# Patient Record
Sex: Female | Born: 1990 | Race: Black or African American | Hispanic: No | Marital: Single | Smoking: Never smoker
Health system: Southern US, Community
[De-identification: ages and names within clinical notes are randomized; demographics above are authoritative.]

## PROBLEM LIST (undated history)

## (undated) DIAGNOSIS — D571 Sickle-cell disease without crisis: Secondary | ICD-10-CM

## (undated) DIAGNOSIS — A419 Sepsis, unspecified organism: Secondary | ICD-10-CM

## (undated) DIAGNOSIS — I82409 Acute embolism and thrombosis of unspecified deep veins of unspecified lower extremity: Secondary | ICD-10-CM

## (undated) DIAGNOSIS — O139 Gestational [pregnancy-induced] hypertension without significant proteinuria, unspecified trimester: Secondary | ICD-10-CM

## (undated) HISTORY — DX: Sepsis, unspecified organism: A41.9

## (undated) HISTORY — DX: Acute embolism and thrombosis of unspecified deep veins of unspecified lower extremity: I82.409

---

## 2021-03-08 ENCOUNTER — Encounter (HOSPITAL_COMMUNITY): Payer: Self-pay | Admitting: Emergency Medicine

## 2021-03-08 ENCOUNTER — Other Ambulatory Visit: Payer: Self-pay

## 2021-03-08 ENCOUNTER — Ambulatory Visit (HOSPITAL_COMMUNITY)
Admission: EM | Admit: 2021-03-08 | Discharge: 2021-03-08 | Disposition: A | Discharge status: Home / Self Care | Payer: Medicaid Other

## 2021-03-08 DIAGNOSIS — R21 Rash and other nonspecific skin eruption: Secondary | ICD-10-CM

## 2021-03-08 HISTORY — DX: Sickle-cell disease without crisis: D57.1

## 2021-03-08 MED ORDER — METRONIDAZOLE 1 % EX GEL: Freq: Every day | CUTANEOUS | 0 refills | Status: DC

## 2021-03-08 NOTE — Discharge Instructions (Addendum)
Switch from mint or cinnamon flavored toothpaste to fruit flavors x1 months to see if this helps with symptoms

## 2021-03-08 NOTE — ED Provider Notes (Signed)
[facility]    CSN: [id number] Arrival date & time: 03/08/21  8144      History   Chief Complaint Chief Complaint  Patient presents with  . Rash    HPI Sonya Tapia is a 30 y.o. female.   HPI   Rash: Patient states that she has had a facial rash that started around December.  The rash has improved but has not fully resolved.  She states that the rash has improved with reducing any products to the face.  She believes this is perioral dermatitis as she has watched many videos on TikTok that describes similar symptoms of pustules and bumps around the nose and mouth area.  The only products she is using are a gentle facial wash Cetaphil and a gentle facial moisturizer for sensitive skin. No trouble breathing or swallowing and no swelling of the face. No new medications. No oral, nasal or topical steroid use.   Past Medical History:  Diagnosis Date  . Sickle cell anemia (HCC)     There are no problems to display for this patient.   Past Surgical History:  Procedure Laterality Date  . CESAREAN SECTION      OB History   No obstetric history on file.      Home Medications    Prior to Admission medications   Medication Sig Start Date End Date Taking? Authorizing Provider  norethindrone (MICRONOR) 0.35 MG tablet Take 1 tablet by mouth daily. 02/19/21  Yes [provider]    Family History Family History  Problem Relation Age of Onset  . Healthy Mother   . Healthy Father     Social History Social History   Tobacco Use  . Smoking status: Never Smoker  . Smokeless tobacco: Never Used  Vaping Use  . Vaping Use: Former  Substance Use Topics  . Alcohol use: Not Currently    Comment: occ  . Drug use: Never     Allergies   Patient has no known allergies.   Review of Systems Review of Systems  As stated above in HPI Physical Exam Triage Vital Signs ED Triage Vitals  Enc Vitals Group     BP 03/08/21 0832 (!) 127/95     Pulse Rate  03/08/21 0834 64     Resp 03/08/21 0834 16     Temp 03/08/21 0832 98.3 F (36.8 C)     Temp src --      SpO2 03/08/21 0834 100 %     Weight 03/08/21 0834 180 lb (81.6 kg)     Height 03/08/21 0834 5\' 4"  (1.626 m)     Head Circumference --      Peak Flow --      Pain Score 03/08/21 0827 0     Pain Loc --      Pain Edu? --      Excl. in GC? --    No data found.  Updated Vital Signs BP (!) 127/95 (BP Location: Right Arm)   Pulse 64   Temp 98.3 F (36.8 C)   Resp 16   Ht 5\' 4"  (1.626 m)   Wt 180 lb (81.6 kg)   LMP 03/01/2021 (Exact Date)   SpO2 100%   BMI 30.90 kg/m   Physical Exam Vitals and nursing note reviewed.  Constitutional:      General: She is not in acute distress.    Appearance: Normal appearance. She is not ill-appearing, toxic-appearing or diaphoretic.  HENT:     Mouth/Throat:  Comments: No swelling of the lips or tongue Skin:    Comments: Rash is difficult to visualize. Scattered small papules of mouth and nose with scant erythema.   Neurological:     Mental Status: She is alert.      UC Treatments / Results  Labs (all labs ordered are listed, but only abnormal results are displayed) Labs Reviewed - No data to display  EKG   Radiology No results found.  Procedures Procedures (including critical care time)  Medications Ordered in UC Medications - No data to display  Initial Impression / Assessment and Plan / UC Course  I have reviewed the triage vital signs and the nursing notes.  Pertinent labs & imaging results that were available during my care of the patient were reviewed by me and considered in my medical decision making (see chart for details).     New.  Discussed perioral dermatitis with patient.  I have recommended that she switch from meat-based toothpaste to fruit-based toothpaste x1 month to see if this helps with symptoms.  She can continue her products as these should not cause irritation and she has not noticed a  correlation.  If in 1 month symptoms are not improved or should symptoms worsen she will start the MetroGel to treat for any potential rosacea.  Final Clinical Impressions(s) / UC Diagnoses   Final diagnoses:  None   Discharge Instructions   None    ED Prescriptions    None     PDMP not reviewed this encounter.   Rushie Chestnut, [place] 03/08/21 [phone]

## 2021-03-08 NOTE — ED Triage Notes (Signed)
Pt presents today with intermittent facial rash that began in December. No swelling or SOB.

## 2021-07-16 DIAGNOSIS — E559 Vitamin D deficiency, unspecified: Secondary | ICD-10-CM

## 2021-07-16 HISTORY — DX: Vitamin D deficiency, unspecified: E55.9

## 2022-01-23 ENCOUNTER — Telehealth (INDEPENDENT_AMBULATORY_CARE_PROVIDER_SITE_OTHER): Payer: Medicaid Other

## 2022-01-23 DIAGNOSIS — O0990 Supervision of high risk pregnancy, unspecified, unspecified trimester: Secondary | ICD-10-CM | POA: Insufficient documentation

## 2022-01-23 DIAGNOSIS — D571 Sickle-cell disease without crisis: Secondary | ICD-10-CM | POA: Insufficient documentation

## 2022-01-23 DIAGNOSIS — Z3A00 Weeks of gestation of pregnancy not specified: Secondary | ICD-10-CM

## 2022-01-23 DIAGNOSIS — O99019 Anemia complicating pregnancy, unspecified trimester: Secondary | ICD-10-CM

## 2022-01-23 MED ORDER — BLOOD PRESSURE MONITORING DEVI: 1.0000 | 0 refills | Status: DC

## 2022-01-23 NOTE — Patient Instructions (Signed)

## 2022-01-23 NOTE — Progress Notes (Signed)
New OB Intake ? ?I connected with  Sonya Tapia on 01/23/22 at  1:15 PM EST by MyChart Video Visit and verified that I am speaking with the correct person using two identifiers. Nurse is located at [facility] and pt is located at [employer]. ? ?I discussed the limitations, risks, security and privacy concerns of performing an evaluation and management service by telephone and the availability of in person appointments. I also discussed with the patient that there may be a patient responsible charge related to this service. The patient expressed understanding and agreed to proceed. ? ?I explained I am completing New OB Intake today. We discussed her EDD of 06/24/22 that is based on LMP of 09/17/21. Pt is G2/P1. I reviewed her allergies, medications, Medical/Surgical/OB history, and appropriate screenings. I informed her of [facility] services. Based on history, this is a/an  pregnancy complicated by Sickle Cell  .  ? ?Patient Active Problem List  ? Diagnosis Date Noted  ? Supervision of high risk pregnancy, antepartum 01/23/2022  ? Sickle cell anemia of mother during pregnancy [facility]) 01/23/2022  ? ? ?Concerns addressed today ? ?Delivery Plans:  ?Plans to deliver at [facility].  ? ?Waterbirth candidate?  ? ?MyChart/Babyscripts ?MyChart access verified. I explained pt will have some visits in office and some virtually. Babyscripts instructions given and order placed. Patient verifies receipt of registration text/e-mail. Account successfully created and app downloaded. ? ?Blood Pressure Cuff  ?Blood pressure cuff ordered for patient to pick-up from [employer]. Explained after first prenatal appt pt will check weekly and document in 30. ? ?Weight scale: Patient does / does not  have weight scale. Weight scale ordered for patient to pick up from [employer].  ? ?Anatomy US ?Explained first scheduled [place] will be around 19 weeks. Anatomy US scheduled for 02/06/22 at 01:30. Pt notified to arrive at 01:15. ?Scheduled AFP  lab only appointment if CenteringPregnancy pt for same day as anatomy US.  ? ?Labs ?Discussed [name] genetic screening with patient. Would like both Panorama and Horizon drawn at new OB visit.Also if interested in genetic testing, tell patient she will need AFP 15-21 weeks to complete genetic testing .Routine prenatal labs needed. ? ?Covid Vaccine ?Patient has covid vaccine.  ? ?Is patient a CenteringPregnancy candidate? Not a candidate  ? ?Is patient a Mom+Baby Combined Care candidate? Not a candidate    ? ?Informed patient of [facility] website  and placed link in her AVS.  ? ?Social Determinants of Health ?Food Insecurity: Patient denies food insecurity. ?WIC Referral: Patient is interested in referral to [facility].  ?Transportation: Patient denies transportation needs. ?Childcare: Discussed no children allowed at ultrasound appointments. Offered childcare services; patient declines childcare services at this time. ? ?Send link to Pregnancy Navigators ? ? ?Placed OB Box on problem list and updated ? ?First visit review ?I reviewed new OB appt with pt. I explained she will have a pelvic exam, ob bloodwork with genetic screening, and PAP smear. Explained pt will be seen by Dr.Constant at first visit; encounter routed to appropriate provider. Explained that patient will be seen by pregnancy navigator following visit with provider. [facility] information placed in AVS.  ? ?Bethanne Ginger, CMA ?01/23/2022  1:44 PM  ?

## 2022-01-25 NOTE — Progress Notes (Signed)
Patient was assessed and managed by nursing staff during this encounter. I have reviewed the chart and agree with the documentation and plan. I have also made any necessary editorial changes. ? ?Mora Bellman, MD ?01/25/2022 1:35 PM  ? ?

## 2022-01-30 ENCOUNTER — Telehealth: Payer: Self-pay | Admitting: *Deleted

## 2022-01-30 NOTE — Telephone Encounter (Signed)
Called patient to offer CenteringPregnancy prenatal care. Explained what this means. Patient declines. ?Sonya Tapia ?

## 2022-02-06 ENCOUNTER — Ambulatory Visit: Payer: Medicaid Other | Admitting: *Deleted

## 2022-02-06 ENCOUNTER — Ambulatory Visit (HOSPITAL_BASED_OUTPATIENT_CLINIC_OR_DEPARTMENT_OTHER): Payer: Medicaid Other | Admitting: Obstetrics and Gynecology

## 2022-02-06 ENCOUNTER — Ambulatory Visit: Payer: Medicaid Other | Attending: Obstetrics and Gynecology

## 2022-02-06 ENCOUNTER — Other Ambulatory Visit: Payer: Self-pay | Admitting: *Deleted

## 2022-02-06 ENCOUNTER — Other Ambulatory Visit (HOSPITAL_COMMUNITY)
Admission: RE | Admit: 2022-02-06 | Discharge: 2022-02-06 | Disposition: A | Discharge status: Home / Self Care | Payer: Medicaid Other | Source: Ambulatory Visit | Attending: Obstetrics and Gynecology | Admitting: Obstetrics and Gynecology

## 2022-02-06 ENCOUNTER — Other Ambulatory Visit: Payer: Self-pay

## 2022-02-06 ENCOUNTER — Ambulatory Visit (INDEPENDENT_AMBULATORY_CARE_PROVIDER_SITE_OTHER): Payer: Medicaid Other | Admitting: Obstetrics and Gynecology

## 2022-02-06 ENCOUNTER — Encounter: Payer: Self-pay | Admitting: Obstetrics and Gynecology

## 2022-02-06 VITALS — BP 119/76 | HR 87

## 2022-02-06 VITALS — BP 116/77 | HR 85 | Wt 177.3 lb

## 2022-02-06 DIAGNOSIS — O0990 Supervision of high risk pregnancy, unspecified, unspecified trimester: Secondary | ICD-10-CM

## 2022-02-06 DIAGNOSIS — O34219 Maternal care for unspecified type scar from previous cesarean delivery: Secondary | ICD-10-CM

## 2022-02-06 DIAGNOSIS — Z363 Encounter for antenatal screening for malformations: Secondary | ICD-10-CM | POA: Insufficient documentation

## 2022-02-06 DIAGNOSIS — D571 Sickle-cell disease without crisis: Secondary | ICD-10-CM

## 2022-02-06 DIAGNOSIS — Z3A19 19 weeks gestation of pregnancy: Secondary | ICD-10-CM | POA: Diagnosis not present

## 2022-02-06 DIAGNOSIS — D563 Thalassemia minor: Secondary | ICD-10-CM

## 2022-02-06 DIAGNOSIS — Z8616 Personal history of COVID-19: Secondary | ICD-10-CM | POA: Insufficient documentation

## 2022-02-06 DIAGNOSIS — O99019 Anemia complicating pregnancy, unspecified trimester: Secondary | ICD-10-CM | POA: Insufficient documentation

## 2022-02-06 DIAGNOSIS — O99012 Anemia complicating pregnancy, second trimester: Secondary | ICD-10-CM | POA: Diagnosis not present

## 2022-02-06 DIAGNOSIS — O0992 Supervision of high risk pregnancy, unspecified, second trimester: Secondary | ICD-10-CM | POA: Insufficient documentation

## 2022-02-06 DIAGNOSIS — Z98891 History of uterine scar from previous surgery: Secondary | ICD-10-CM | POA: Insufficient documentation

## 2022-02-06 LAB — POCT URINALYSIS DIP (DEVICE)
Glucose, UA: NEGATIVE mg/dL
Hgb urine dipstick: NEGATIVE
Ketones, ur: 40 mg/dL — AB
Leukocytes,Ua: NEGATIVE
Nitrite: NEGATIVE
Protein, ur: NEGATIVE mg/dL
Specific Gravity, Urine: 1.02 (ref 1.005–1.030)
Urobilinogen, UA: 0.2 mg/dL (ref 0.0–1.0)
pH: 5.5 (ref 5.0–8.0)

## 2022-02-06 MED ORDER — ASPIRIN EC 81 MG PO TBEC: 81.0000 mg | DELAYED_RELEASE_TABLET | Freq: Every day | ORAL | 2 refills | Status: DC

## 2022-02-06 NOTE — Progress Notes (Signed)
Maternal-Fetal Medicine  ? ?Name: Sonya Tapia ?DOB: [birth year] ?MRN: [id number] ?Referring Provider: Catalina Antigua, MD ? ?I had the pleasure of seeing Ms. Cahn [facility]. She is G2 P1001 at 20w+ gestation and is here for fetal anatomy scan. She is unsure of her LMP date and this is here first ultrasound. ?Patient has hemoglobin [place] disease.  She reports she has not had acute pain crisis since age 31 when she was admitted to the hospital.  No history of acute chest syndrome or pneumonia.  No history of recurrent urinary tract infections.  No history of blood transfusions.  No history of stroke or any neurological complications.  No history of deep vein thrombosis. ?Patient had COVID infection in 2020 during her pregnancy and had an uneventful recovery. ?No history of hypertension or diabetes or any other chronic medical conditions. ? ?Past surgical history: Nil of note. ?Medications: Prenatal vitamins, vitamin D supplements. ?Allergies: No known drug allergies. ?Social history: Denies tobacco or drug or alcohol use.  Her partner is [place] American he is a father of her first child.  His sickle cell carrier status is not known. ?Family history: No history of sickle cell anemia in the family.  Siblings have sickle cell trait. ? ?Obstetric history significant for a term cesarean delivery (failure to progress in labor) November 2021 of a female infant weighing 8 pounds and 1 ounce at birth.  Her pregnancy was uneventful. ?GYN history: No history of abnormal Pap smears or cervical surgeries.  No history of breast disease. ? ?Ultrasound ?We performed a fetal anatomical survey.  Amniotic fluid is normal and good fetal activity seen.  No markers of aneuploidies or fetal structural defects are seen.  Fetal biometry is consistent with 19 weeks and 6 days gestation. ?Placenta is anterior and there is no evidence of previa or placenta accreta spectrum ?We have assigned her EDD at 06/27/2022  based on today's ultrasound measurements. ? ?Labs: Patient had prenatal labs drawn today.  Her previous hemoglobin (November 2022) was 11 g/dL. ? ?Our concerns include ?Sickle cell anemia ?-Pregnancy in women with sickle cell anemia is associated with high risk morbidity and mortality.  Pregnant women are at an increased risk of sickle-related complications including acute pain crisis and acute chest syndrome (occurs in 10% of patients). ? ?-Management of acute pain crisis involves intravenous fluids, morphine and blood transfusion as needed. ? ?-Hypertension, venous thromboembolism and urinary tract infections are increased.  Incidence of deep vein thrombosis is higher in pregnant women. ? ?-Incidence of hypertension/preeclampsia is increased.  I discussed the benefit of low-dose aspirin prophylaxis in this pregnancy that delays or prevents preeclampsia.  Preterm delivery is more common in pregnancies complicated with sickle cell anemia. ? ?-Overall, the risk of complications is lower with hemoglobin [place] disease. ? ?-In the absence of hospitalization and prolonged immobilization, thromboprophylaxis is not recommended. ? ?-Discussed the importance of vaccination including pneumococcal and influenza vaccine.  Both may be given in pregnancy. ? ?I discussed ultrasound protocol of monitoring fetal growth assessments every 4 weeks and weekly BPP from [redacted] weeks gestation till delivery. ? ?Because of increased risk of placental insufficiency and preeclampsia, delivery between 38 and [redacted] weeks gestation is reasonable. ? ?Prenatal diagnosis ?I emphasized the importance of partner screening.  If her partner is a carrier, there is a 50% chance of the fetus being affected with sickle cell disease.  I recommended genetic counseling.  Patient would like to discuss with her partner and  decide. ? ?Previous cesarean delivery ?I discussed VBAC and its benefits and risks.  The risk of uterine scar rupture is about 1%.  Repeat cesarean  deliveries increase the risk of placenta previa and/or placenta accreta spectrum. ? ?Recommendations ?-Low-dose aspirin prophylaxis. ?-Vaccinations to be addressed at prenatal visit. ?-Genetic counseling of the patient is willing. ?-Screen for urinary tract infections every trimester. ?-Baseline comprehensive metabolic panel ?-Maternal echocardiography may be considered if patient has symptoms of shortness of breath or chest pain. ?-Vitamin D supplements to continue ?-Fetal growth assessments every 4 weeks. ?-Weekly BPP from [redacted] weeks gestation till delivery. ?-Delivery at 71- or 39-weeks' gestation. ? ?Thank you for consultation.  If you have any questions or concerns, please contact me the [facility].  Consultation including face-to-face (more than 50%) counseling 45 minutes. ? ?

## 2022-02-06 NOTE — Progress Notes (Signed)
?  Subjective:  ? ? Sonya Tapia is a G2P1001 [redacted]w[redacted]d being seen today for her first obstetrical visit.  Her obstetrical history is significant for obesity and previous cesarean section due to failure to progress at 4 cm . Patient reports history of sickle cell disease with last crisis at the age of 19. Patient does intend to breast feed. Pregnancy history fully reviewed. ? ?Patient reports no complaints. ? ?Vitals:  ? 02/06/22 1555  ?BP: 116/77  ?Pulse: 85  ?Weight: 177 lb 4.8 oz (80.4 kg)  ? ? ?HISTORY: ?OB History  ?Gravida Para Term Preterm AB Living  ?2 1 1     1   ?SAB IAB Ectopic Multiple Live Births  ?        1  ?  ?# Outcome Date GA Lbr Len/2nd Weight Sex Delivery Anes PTL Lv  ?2 Current           ?1 Term 03/26/20   8 lb (3.629 kg) M CS-Unspec  N LIV  ? ?Past Medical History:  ?Diagnosis Date  ?? DVT (deep venous thrombosis) ([place])   ?? Sickle cell anemia (HCC)   ? ?Past Surgical History:  ?Procedure Laterality Date  ?? CESAREAN SECTION    ? ?Family History  ?Problem Relation Age of Onset  ?? Hypertension Mother   ?? Healthy Father   ? ? ? ?Exam  ? ? ?Uterus:  Fundal Height: 20 cm  ?Pelvic Exam:   ? Perineum: No Hemorrhoids, Normal Perineum  ? Vulva: normal  ? Vagina:  normal mucosa, normal discharge  ? pH:   ? Cervix: multiparous appearance and cervix is closed and long  ? Adnexa: not evaluated  ? Bony Pelvis: gynecoid  ?System: Breast:  normal appearance, no masses or tenderness  ? Skin: normal coloration and turgor, no rashes ?  ? Neurologic: oriented, no focal deficits  ? Extremities: normal strength, tone, and muscle mass  ? HEENT extra ocular movement intact  ? Mouth/Teeth mucous membranes moist, pharynx normal without lesions and dental hygiene good  ? Neck supple and no masses  ? Cardiovascular: regular rate and rhythm  ? Respiratory:  appears well, vitals normal, no respiratory distress, acyanotic, normal RR, chest clear, no wheezing, crepitations, rhonchi, normal symmetric air entry  ? Abdomen: soft,  non-tender; bowel sounds normal; no masses,  no organomegaly and gravid  ? Urinary:   ? ? ?  ?Assessment:  ? ? Pregnancy: G2P1001 ?Patient Active Problem List  ? Diagnosis Date Noted  ?? Previous cesarean section 02/06/2022  ?? Supervision of high risk pregnancy, antepartum 01/23/2022  ?? Sickle cell anemia of mother during pregnancy [facility]) 01/23/2022  ? ?  ? ?  ?Plan:  ? ?  ?Initial labs drawn. ?Prenatal vitamins. ?Problem list reviewed and updated. ?Genetic Screening discussed : ordered. ? Ultrasound discussed; fetal survey: done today. ?Information on TOLAC vs RCS provided ? Follow up in 4 weeks. ?50% of 30 min visit spent on counseling and coordination of care.  ?  ? ?Jaislyn Blinn ?02/06/2022 ? ? ?

## 2022-02-06 NOTE — Patient Instructions (Signed)

## 2022-02-07 LAB — CBC/D/PLT+RPR+RH+ABO+RUBIGG...
Antibody Screen: NEGATIVE
Basophils Absolute: 0 10*3/uL (ref 0.0–0.2)
Basos: 0 %
EOS (ABSOLUTE): 0 10*3/uL (ref 0.0–0.4)
Eos: 0 %
HCV Ab: NONREACTIVE
HIV Screen 4th Generation wRfx: NONREACTIVE
Hematocrit: 29.4 % — ABNORMAL LOW (ref 34.0–46.6)
Hemoglobin: 9.8 g/dL — ABNORMAL LOW (ref 11.1–15.9)
Hepatitis B Surface Ag: NEGATIVE
Immature Grans (Abs): 0 10*3/uL (ref 0.0–0.1)
Immature Granulocytes: 0 %
Lymphocytes Absolute: 1.1 10*3/uL (ref 0.7–3.1)
Lymphs: 16 %
MCH: 26.2 pg — ABNORMAL LOW (ref 26.6–33.0)
MCHC: 33.3 g/dL (ref 31.5–35.7)
MCV: 79 fL (ref 79–97)
Monocytes Absolute: 0.3 10*3/uL (ref 0.1–0.9)
Monocytes: 4 %
Neutrophils Absolute: 5.4 10*3/uL (ref 1.4–7.0)
Neutrophils: 80 %
Platelets: 135 10*3/uL — ABNORMAL LOW (ref 150–450)
RBC: 3.74 x10E6/uL — ABNORMAL LOW (ref 3.77–5.28)
RDW: 16.1 % — ABNORMAL HIGH (ref 11.7–15.4)
RPR Ser Ql: NONREACTIVE
Rh Factor: POSITIVE
Rubella Antibodies, IGG: 5.71 index (ref >0.99)
WBC: 6.8 10*3/uL (ref 3.4–10.8)

## 2022-02-07 LAB — HCV INTERPRETATION

## 2022-02-07 LAB — GC/CHLAMYDIA PROBE AMP (~~LOC~~) NOT AT ARMC
Chlamydia: NEGATIVE
Comment: NEGATIVE
Comment: NORMAL
Neisseria Gonorrhea: NEGATIVE

## 2022-02-07 LAB — HEMOGLOBIN A1C
Est. average glucose Bld gHb Est-mCnc: <74 mg/dL
Hgb A1c MFr Bld: <4.2 % — ABNORMAL LOW (ref 4.8–5.6)

## 2022-02-07 MED ORDER — FERROUS SULFATE 325 (65 FE) MG PO TABS: 325.0000 mg | ORAL_TABLET | ORAL | 1 refills | Status: AC

## 2022-02-07 NOTE — Addendum Note (Signed)
Addended by: Catalina Antigua on: 02/07/2022 11:40 AM ? ? Modules accepted: Orders ? ?

## 2022-02-09 LAB — CULTURE, OB URINE

## 2022-02-09 LAB — URINE CULTURE, OB REFLEX

## 2022-03-03 ENCOUNTER — Encounter: Payer: Self-pay | Admitting: *Deleted

## 2022-03-05 ENCOUNTER — Encounter: Payer: Medicaid Other | Admitting: Obstetrics & Gynecology

## 2022-03-06 ENCOUNTER — Ambulatory Visit: Payer: Medicaid Other

## 2022-03-06 ENCOUNTER — Encounter: Payer: Medicaid Other | Admitting: Obstetrics & Gynecology

## 2022-03-06 NOTE — Telephone Encounter (Signed)
CMA attempt to reach patient to inform on genetic carrier screen result. No answer and LVM. 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 ? ?-Felecia Shelling, cma ?03/06/2022. ?

## 2022-03-07 NOTE — Addendum Note (Signed)
Addended by: Catalina Antigua on: 03/07/2022 11:44 AM ? ? Modules accepted: Orders ? ?

## 2022-03-10 ENCOUNTER — Ambulatory Visit: Payer: Medicaid Other | Admitting: *Deleted

## 2022-03-10 ENCOUNTER — Other Ambulatory Visit: Payer: Self-pay | Admitting: *Deleted

## 2022-03-10 ENCOUNTER — Ambulatory Visit: Payer: Medicaid Other | Attending: Obstetrics and Gynecology

## 2022-03-10 ENCOUNTER — Encounter: Payer: Medicaid Other | Admitting: Obstetrics and Gynecology

## 2022-03-10 ENCOUNTER — Encounter: Payer: Self-pay | Admitting: *Deleted

## 2022-03-10 VITALS — BP 126/73 | HR 89

## 2022-03-10 DIAGNOSIS — O99012 Anemia complicating pregnancy, second trimester: Secondary | ICD-10-CM

## 2022-03-10 DIAGNOSIS — D563 Thalassemia minor: Secondary | ICD-10-CM | POA: Diagnosis not present

## 2022-03-10 DIAGNOSIS — O0990 Supervision of high risk pregnancy, unspecified, unspecified trimester: Secondary | ICD-10-CM | POA: Diagnosis present

## 2022-03-10 DIAGNOSIS — Z3689 Encounter for other specified antenatal screening: Secondary | ICD-10-CM

## 2022-03-10 DIAGNOSIS — D571 Sickle-cell disease without crisis: Secondary | ICD-10-CM | POA: Diagnosis not present

## 2022-03-10 DIAGNOSIS — O99019 Anemia complicating pregnancy, unspecified trimester: Secondary | ICD-10-CM | POA: Insufficient documentation

## 2022-03-10 DIAGNOSIS — Z98891 History of uterine scar from previous surgery: Secondary | ICD-10-CM | POA: Insufficient documentation

## 2022-03-10 DIAGNOSIS — O34219 Maternal care for unspecified type scar from previous cesarean delivery: Secondary | ICD-10-CM

## 2022-03-10 DIAGNOSIS — Z362 Encounter for other antenatal screening follow-up: Secondary | ICD-10-CM

## 2022-03-10 DIAGNOSIS — O285 Abnormal chromosomal and genetic finding on antenatal screening of mother: Secondary | ICD-10-CM

## 2022-03-10 DIAGNOSIS — O99212 Obesity complicating pregnancy, second trimester: Secondary | ICD-10-CM

## 2022-03-10 DIAGNOSIS — Z3A24 24 weeks gestation of pregnancy: Secondary | ICD-10-CM

## 2022-03-10 DIAGNOSIS — E669 Obesity, unspecified: Secondary | ICD-10-CM

## 2022-03-20 ENCOUNTER — Other Ambulatory Visit (HOSPITAL_COMMUNITY)
Admission: RE | Admit: 2022-03-20 | Discharge: 2022-03-20 | Disposition: A | Discharge status: Home / Self Care | Payer: Medicaid Other | Source: Ambulatory Visit | Attending: Obstetrics & Gynecology | Admitting: Obstetrics & Gynecology

## 2022-03-20 ENCOUNTER — Ambulatory Visit (INDEPENDENT_AMBULATORY_CARE_PROVIDER_SITE_OTHER): Payer: Medicaid Other | Admitting: Family Medicine

## 2022-03-20 VITALS — BP 121/81 | HR 97 | Wt 190.9 lb

## 2022-03-20 DIAGNOSIS — D571 Sickle-cell disease without crisis: Secondary | ICD-10-CM

## 2022-03-20 DIAGNOSIS — N898 Other specified noninflammatory disorders of vagina: Secondary | ICD-10-CM | POA: Insufficient documentation

## 2022-03-20 DIAGNOSIS — Z86718 Personal history of other venous thrombosis and embolism: Secondary | ICD-10-CM | POA: Insufficient documentation

## 2022-03-20 DIAGNOSIS — O99019 Anemia complicating pregnancy, unspecified trimester: Secondary | ICD-10-CM

## 2022-03-20 DIAGNOSIS — Z98891 History of uterine scar from previous surgery: Secondary | ICD-10-CM

## 2022-03-20 DIAGNOSIS — D5720 Sickle-cell/Hb-C disease without crisis: Secondary | ICD-10-CM | POA: Insufficient documentation

## 2022-03-20 DIAGNOSIS — O0990 Supervision of high risk pregnancy, unspecified, unspecified trimester: Secondary | ICD-10-CM

## 2022-03-20 DIAGNOSIS — D563 Thalassemia minor: Secondary | ICD-10-CM

## 2022-03-20 MED ORDER — ENOXAPARIN SODIUM 40 MG/0.4ML IJ SOSY: 40.0000 mg | PREFILLED_SYRINGE | INTRAMUSCULAR | 3 refills | Status: DC

## 2022-03-20 NOTE — Progress Notes (Signed)
? ?  PRENATAL VISIT NOTE ? ?Subjective:  ?Sonya Tapia is a 31 y.o. G2P1001 at [redacted]w[redacted]d being seen today for ongoing prenatal care.  She is currently monitored for the following issues for this high-risk pregnancy and has Supervision of high risk pregnancy, antepartum; Sickle-HgbC anemia of mother during pregnancy [facility]); Previous cesarean section; Vitamin D deficiency; [place] disease (HCC); Alpha thalassemia silent carrier; and History of DVT (deep vein thrombosis) on their problem list. ? ?Patient reports no complaints.  Contractions: Not present. Vag. Bleeding: None.  Movement: Present. Denies leaking of fluid.  ? ?The following portions of the patient's history were reviewed and updated as appropriate: allergies, current medications, past family history, past medical history, past social history, past surgical history and problem list.  ? ?Objective:  ? ?Vitals:  ? 03/20/22 1609  ?BP: 121/81  ?Pulse: 97  ?Weight: 190 lb 14.4 oz (86.6 kg)  ? ? ?Fetal Status: Fetal Heart Rate (bpm): 145   Movement: Present    ? ?General:  Alert, oriented and cooperative. Patient is in no acute distress.  ?Skin: Skin is warm and dry. No rash noted.   ?Cardiovascular: Normal heart rate noted  ?Respiratory: Normal respiratory effort, no problems with respiration noted  ?Abdomen: Soft, gravid, appropriate for gestational age.  Pain/Pressure: Absent     ?Pelvic: Cervical exam deferred        ?Extremities: Normal range of motion.  Edema: None  ?Mental Status: Normal mood and affect. Normal behavior. Normal judgment and thought content.  ? ?Assessment and Plan:  ?Pregnancy: G2P1001 at [redacted]w[redacted]d ?1. Previous cesarean section ?Desires TOLAC ? ?2. Sickle cell anemia of mother during pregnancy [facility]) ?[place] disease ? ?3. Supervision of high risk pregnancy, antepartum ?Continue routine prenatal care. ?28 wks labs next ? ?4. Sickle cell-hemoglobin C disease without crisis (HCC) ?No pain ? ?5. Alpha thalassemia silent carrier ?Carrier--offered partner  testing ? ?6. Vaginal irritation ?Check wet prep and treat appropriately ?- Cervicovaginal ancillary only( [place]) ? ?7. History of DVT (deep vein thrombosis) ?While on OC's and had COVID. On lovenox last pregnancy. ?Reviewed with MFM--should be on ppx anti-coagulation ?- enoxaparin (LOVENOX) 40 MG/0.4ML injection; Inject 0.4 mLs (40 mg total) into the skin daily.  Dispense: 4 mL; Refill: 3 ? ?Preterm labor symptoms and general obstetric precautions including but not limited to vaginal bleeding, contractions, leaking of fluid and fetal movement were reviewed in detail with the patient. ?Please refer to After Visit Summary for other counseling recommendations.  ? ?Return in about 3 weeks (around 04/10/2022) for HRC, 28 wk labs. ? ?Future Appointments  ?Date Time Provider Department Center  ?04/08/2022 11:15 AM [facility] NURSE [facility]  ?04/08/2022 11:30 AM [facility] US3 [facility]  ?04/10/2022  9:30 AM [facility] LAB [facility]  ?04/10/2022 10:15 AM Warden Fillers, MD [facility]  ? ? ?Reva Bores, MD ? ?

## 2022-03-21 LAB — CERVICOVAGINAL ANCILLARY ONLY
Bacterial Vaginitis (gardnerella): NEGATIVE
Candida Glabrata: NEGATIVE
Candida Vaginitis: POSITIVE — AB
Chlamydia: NEGATIVE
Comment: NEGATIVE
Comment: NEGATIVE
Comment: NEGATIVE
Comment: NEGATIVE
Comment: NEGATIVE
Comment: NORMAL
Neisseria Gonorrhea: NEGATIVE
Trichomonas: NEGATIVE

## 2022-03-22 MED ORDER — TERCONAZOLE 0.8 % VA CREA
1.0000 | TOPICAL_CREAM | Freq: Every day | VAGINAL | 0 refills | Status: DC
Start: 2022-03-22 — End: 2022-05-13

## 2022-03-22 NOTE — Addendum Note (Signed)
Addended by: Reva Bores on: 03/22/2022 01:19 PM ? ? Modules accepted: Orders ? ?

## 2022-04-08 ENCOUNTER — Ambulatory Visit: Payer: Medicaid Other | Admitting: *Deleted

## 2022-04-08 ENCOUNTER — Encounter: Payer: Self-pay | Admitting: *Deleted

## 2022-04-08 ENCOUNTER — Other Ambulatory Visit: Payer: Self-pay | Admitting: *Deleted

## 2022-04-08 ENCOUNTER — Ambulatory Visit: Payer: Medicaid Other | Attending: Obstetrics

## 2022-04-08 VITALS — BP 117/72 | HR 89

## 2022-04-08 DIAGNOSIS — Z98891 History of uterine scar from previous surgery: Secondary | ICD-10-CM | POA: Insufficient documentation

## 2022-04-08 DIAGNOSIS — Z86718 Personal history of other venous thrombosis and embolism: Secondary | ICD-10-CM | POA: Insufficient documentation

## 2022-04-08 DIAGNOSIS — O0990 Supervision of high risk pregnancy, unspecified, unspecified trimester: Secondary | ICD-10-CM

## 2022-04-08 DIAGNOSIS — O34219 Maternal care for unspecified type scar from previous cesarean delivery: Secondary | ICD-10-CM | POA: Insufficient documentation

## 2022-04-08 DIAGNOSIS — O99013 Anemia complicating pregnancy, third trimester: Secondary | ICD-10-CM | POA: Diagnosis not present

## 2022-04-08 DIAGNOSIS — Z3689 Encounter for other specified antenatal screening: Secondary | ICD-10-CM

## 2022-04-08 DIAGNOSIS — D571 Sickle-cell disease without crisis: Secondary | ICD-10-CM

## 2022-04-08 DIAGNOSIS — O99019 Anemia complicating pregnancy, unspecified trimester: Secondary | ICD-10-CM | POA: Insufficient documentation

## 2022-04-08 DIAGNOSIS — D563 Thalassemia minor: Secondary | ICD-10-CM | POA: Insufficient documentation

## 2022-04-08 DIAGNOSIS — O09293 Supervision of pregnancy with other poor reproductive or obstetric history, third trimester: Secondary | ICD-10-CM

## 2022-04-08 DIAGNOSIS — O99213 Obesity complicating pregnancy, third trimester: Secondary | ICD-10-CM

## 2022-04-08 DIAGNOSIS — Z3A28 28 weeks gestation of pregnancy: Secondary | ICD-10-CM

## 2022-04-08 DIAGNOSIS — E669 Obesity, unspecified: Secondary | ICD-10-CM

## 2022-04-10 ENCOUNTER — Other Ambulatory Visit: Payer: Medicaid Other

## 2022-04-10 ENCOUNTER — Other Ambulatory Visit: Payer: Self-pay

## 2022-04-10 ENCOUNTER — Ambulatory Visit (INDEPENDENT_AMBULATORY_CARE_PROVIDER_SITE_OTHER): Payer: Medicaid Other | Admitting: Obstetrics and Gynecology

## 2022-04-10 VITALS — BP 127/78 | HR 96 | Wt 188.1 lb

## 2022-04-10 DIAGNOSIS — O0990 Supervision of high risk pregnancy, unspecified, unspecified trimester: Secondary | ICD-10-CM

## 2022-04-10 DIAGNOSIS — Z23 Encounter for immunization: Secondary | ICD-10-CM | POA: Diagnosis not present

## 2022-04-10 DIAGNOSIS — D5720 Sickle-cell/Hb-C disease without crisis: Secondary | ICD-10-CM

## 2022-04-10 DIAGNOSIS — Z86718 Personal history of other venous thrombosis and embolism: Secondary | ICD-10-CM

## 2022-04-10 DIAGNOSIS — Z3A28 28 weeks gestation of pregnancy: Secondary | ICD-10-CM

## 2022-04-10 DIAGNOSIS — O99019 Anemia complicating pregnancy, unspecified trimester: Secondary | ICD-10-CM

## 2022-04-10 DIAGNOSIS — Z98891 History of uterine scar from previous surgery: Secondary | ICD-10-CM

## 2022-04-10 DIAGNOSIS — D563 Thalassemia minor: Secondary | ICD-10-CM

## 2022-04-10 DIAGNOSIS — D571 Sickle-cell disease without crisis: Secondary | ICD-10-CM

## 2022-04-10 NOTE — Progress Notes (Signed)
PRENATAL VISIT NOTE  Subjective:  Sonya Tapia is a 31 y.o. G2P1001 at [redacted]w[redacted]d being seen today for ongoing prenatal care.  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  Patient doing well with no acute concerns today. She reports no complaints.  Contractions: Not present. Vag. Bleeding: None.  Movement: Present. Denies leaking of fluid.   Discussed VBAC in detail with patient  Faculty Practice OB/GYN Attending Consult Note  31 y.o. G2P1001 at [redacted]w[redacted]d with Estimated Date of Delivery: 06/27/22 was seen today in office to discuss trial of labor after cesarean section (TOLAC) versus elective repeat cesarean delivery (ERCD). The following risks were discussed with the patient.  Risk of uterine rupture at term is 0.78 percent with TOLAC and 0.22 percent with ERCD. 1 in 10 uterine ruptures will result in neonatal death or neurological injury. The benefits of a trial of labor after cesarean (TOLAC) resulting in a vaginal birth after cesarean (VBAC) include the following: shorter length of hospital stay and postpartum recovery (in most cases); fewer complications, such as postpartum fever, wound or uterine infection, thromboembolism (blood clots in the leg or lung), need for blood transfusion and fewer neonatal breathing problems. The risks of an attempted VBAC or TOLAC include the following: Risk of failed trial of labor after cesarean (TOLAC) without a vaginal birth after cesarean (VBAC) resulting in repeat cesarean delivery (RCD) in about 20 to 19 percent of women who attempt VBAC.  Risk of rupture of uterus resulting in an emergency cesarean delivery. The risk of uterine rupture may be related in  part to the type of uterine incision made during the first cesarean delivery. A previous transverse uterine incision has the lowest risk of rupture (0.2 to 1.5 percent risk). Vertical or T-shaped uterine incisions have a higher risk of uterine rupture (4 to 9 percent risk)The risk of fetal death is very low with both VBAC and elective repeat cesarean delivery (ERCD), but the likelihood of fetal death is higher with VBAC than with ERCD. Maternal death is very rare with either type of delivery. The risks of an elective repeat cesarean delivery (ERCD) were reviewed with the patient including but not limited to: 12/998 risk of uterine rupture which could have serious consequences, bleeding which may require transfusion; infection which may require antibiotics; injury to bowel, bladder or other surrounding organs (bowel, bladder, ureters); injury to the fetus; need for additional procedures including hysterectomy in the event of a life-threatening hemorrhage; thromboembolic phenomenon; abnormal placentation; incisional problems; death and other postoperative or anesthesia complications.    These risks and benefits are summarized on the consent form, which was reviewed with the patient during the visit.  All her questions answered and she signed a consent indicating a preference for TOLAC/ERCD. A copy of the consent was given to the patient.  Pt is still undecided, will review, discuss with spouse and have decision by next visit.    The following portions of the patient's history were reviewed and updated as appropriate: allergies, current medications, past family history, past medical history, past social history, past surgical history and problem list. Problem list updated.  Objective:   Vitals:   04/10/22 1008  BP: 127/78  Pulse: 96  Weight: 188 lb 1.6 oz (85.3 kg)  Fetal Status: Fetal Heart Rate (bpm): 134 Fundal Height: 28 cm Movement: Present     General:  Alert, oriented and cooperative.  Patient is in no acute distress.  Skin: Skin is warm and dry. No rash noted.   Cardiovascular: Normal heart rate noted  Respiratory: Normal respiratory effort, no problems with respiration noted  Abdomen: Soft, gravid, appropriate for gestational age.  Pain/Pressure: Absent     Pelvic: Cervical exam deferred        Extremities: Normal range of motion.  Edema: Trace  Mental Status:  Normal mood and affect. Normal behavior. Normal judgment and thought content.   Assessment and Plan:  Pregnancy: G2P1001 at [redacted]w[redacted]d  1. Supervision of high risk pregnancy, antepartum Continue routine prenatal care  - Tdap vaccine greater than or equal to 7yo IM  2. [redacted] weeks gestation of pregnancy   3. 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 Previous cesarean section Pt has been counseled on VBAC versus repeat c section.  Consent sent home with pt to review, she will make full decision at next visit  5. 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 Alpha thalassemia silent carrier   7. History of DVT (deep vein thrombosis) Pt continues with current lovenox  Preterm labor symptoms and general obstetric precautions including but not limited to vaginal bleeding, contractions, leaking of fluid and fetal movement were reviewed in detail with the patient.  Please refer to After Visit Summary for other counseling recommendations.   Return in about 2 weeks (around 04/24/2022) for [facility], in person.   Lynnda Shields, MD Faculty Attending Center for [facility]

## 2022-04-11 LAB — GLUCOSE TOLERANCE, 2 HOURS W/ 1HR
Glucose, 1 hour: 90 mg/dL (ref 70–179)
Glucose, 2 hour: 80 mg/dL (ref 70–152)
Glucose, Fasting: 74 mg/dL (ref 70–91)

## 2022-04-12 LAB — CBC
Hematocrit: 30.4 % — ABNORMAL LOW (ref 34.0–46.6)
Hemoglobin: 10 g/dL — ABNORMAL LOW (ref 11.1–15.9)
MCH: 27.3 pg (ref 26.6–33.0)
MCHC: 32.9 g/dL (ref 31.5–35.7)
MCV: 83 fL (ref 79–97)
Platelets: 129 10*3/uL — ABNORMAL LOW (ref 150–450)
RBC: 3.66 x10E6/uL — ABNORMAL LOW (ref 3.77–5.28)
RDW: 16.2 % — ABNORMAL HIGH (ref 11.7–15.4)
WBC: 6.8 10*3/uL (ref 3.4–10.8)

## 2022-04-12 LAB — RPR: RPR Ser Ql: NONREACTIVE

## 2022-04-12 LAB — HIV ANTIBODY (ROUTINE TESTING W REFLEX): HIV Screen 4th Generation wRfx: NONREACTIVE

## 2022-04-24 ENCOUNTER — Ambulatory Visit (INDEPENDENT_AMBULATORY_CARE_PROVIDER_SITE_OTHER): Payer: Medicaid Other | Admitting: Obstetrics & Gynecology

## 2022-04-24 VITALS — BP 120/74 | HR 98 | Wt 190.5 lb

## 2022-04-24 DIAGNOSIS — D5720 Sickle-cell/Hb-C disease without crisis: Secondary | ICD-10-CM

## 2022-04-24 DIAGNOSIS — O0990 Supervision of high risk pregnancy, unspecified, unspecified trimester: Secondary | ICD-10-CM

## 2022-04-24 DIAGNOSIS — Z86718 Personal history of other venous thrombosis and embolism: Secondary | ICD-10-CM

## 2022-04-24 DIAGNOSIS — Z98891 History of uterine scar from previous surgery: Secondary | ICD-10-CM

## 2022-04-24 NOTE — Progress Notes (Signed)
   PRENATAL VISIT NOTE  Subjective:  Sonya Tapia is a 31 y.o. G2P1001 at [redacted]w[redacted]d being seen today for ongoing prenatal care.  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  Patient reports no bleeding, no contractions, and no leaking. Endorses good fetal movement. Contractions: Not present. Vag. Bleeding: None. Movement: Present. Denies leaking of fluid.   The following portions of the patient's history were reviewed and updated as appropriate: allergies, current medications, past family history, past medical history, past social history, past surgical history and problem list.   Objective:   Vitals:   04/24/22 1601  BP: 120/74  Pulse: 98  Weight: 190 lb 8 oz (86.4 kg)    Fetal Status: Fetal Heart Rate (bpm): 134   Movement: Present    General:  Alert, oriented and cooperative. Patient is in no acute distress.  Skin: Skin is warm and dry. No rash noted.   Cardiovascular: Normal heart rate noted  Respiratory: Normal respiratory effort, no problems with respiration noted  Abdomen: Soft, gravid, appropriate for gestational age.  Pain/Pressure: Absent     Pelvic: Cervical exam deferred        Extremities: Normal range of motion.  Edema: None  Mental Status: Normal mood and affect. Normal behavior. Normal judgment and thought content.   Assessment and Plan:  Pregnancy: G2P1001 at [redacted]w[redacted]d  1. Supervision of high risk pregnancy, antepartum -Continue following with MFM and routine care, follow up as scheduled on 6/27  2. 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 Previous cesarean section -Pt signed TOLAC consent forms today and VBAC  discussed in detail  4. History of DVT (deep vein thrombosis) -Continue Lovenox prophylaxis  Preterm labor symptoms and general obstetric precautions including but not limited to vaginal bleeding, contractions, leaking of fluid and fetal movement were reviewed in detail with the patient. Please refer to After Visit Summary for other counseling recommendations.   Return in about 2 weeks (around 05/08/2022).  Future Appointments  Date Time Provider [place]  05/07/2022  2:30 PM [facility] NURSE [facility]  05/07/2022  2:45 PM [facility] US5 [facility]  05/13/2022  9:35 AM Clarnce Flock, MD [facility]  05/13/2022 11:15 AM [facility] NURSE [facility]  05/13/2022 11:30 AM [facility] US3 [facility]  05/21/2022  2:30 PM [facility] NURSE [facility]  05/21/2022  2:45 PM [facility] US5 [facility]  05/28/2022  9:35 AM Caren Macadam, MD [facility]  06/04/2022  1:15 PM Radene Gunning, MD [facility]  06/11/2022  1:15 PM Radene Gunning, MD [facility]  06/18/2022  3:15 PM Caren Macadam, MD [facility]  06/24/2022 10:55 AM Caren Macadam, MD [facility]  07/01/2022 10:15 AM Danielle Rankin [facility]  07/02/2022 11:15 AM [facility] NST [facility] [place]    Lavone Neri, Student-PA 04/24/22 4:20 PM

## 2022-04-24 NOTE — Progress Notes (Signed)
Patient ID: Sonya Tapia, female   DOB: June 28, 1991, 31 y.o.   MRN: [id number] Attestation of Attending Supervision of PA Student: Evaluation and management procedures were performed by the PA student under my supervision and collaboration.  I have reviewed the student's note and chart, and I agree with the management and plan.  Scheryl Darter, MD, FACOG Attending Obstetrician & Gynecologist Faculty Practice, [facility]

## 2022-05-07 ENCOUNTER — Ambulatory Visit: Payer: Medicaid Other | Admitting: *Deleted

## 2022-05-07 ENCOUNTER — Ambulatory Visit: Payer: Medicaid Other | Attending: Obstetrics and Gynecology

## 2022-05-07 VITALS — BP 113/75 | HR 91

## 2022-05-07 DIAGNOSIS — O99213 Obesity complicating pregnancy, third trimester: Secondary | ICD-10-CM | POA: Diagnosis present

## 2022-05-07 DIAGNOSIS — O99019 Anemia complicating pregnancy, unspecified trimester: Secondary | ICD-10-CM | POA: Insufficient documentation

## 2022-05-07 DIAGNOSIS — Z3689 Encounter for other specified antenatal screening: Secondary | ICD-10-CM | POA: Insufficient documentation

## 2022-05-07 DIAGNOSIS — D571 Sickle-cell disease without crisis: Secondary | ICD-10-CM | POA: Diagnosis not present

## 2022-05-07 DIAGNOSIS — O09293 Supervision of pregnancy with other poor reproductive or obstetric history, third trimester: Secondary | ICD-10-CM

## 2022-05-07 DIAGNOSIS — E669 Obesity, unspecified: Secondary | ICD-10-CM

## 2022-05-07 DIAGNOSIS — Z86718 Personal history of other venous thrombosis and embolism: Secondary | ICD-10-CM | POA: Insufficient documentation

## 2022-05-07 DIAGNOSIS — O0990 Supervision of high risk pregnancy, unspecified, unspecified trimester: Secondary | ICD-10-CM | POA: Diagnosis present

## 2022-05-07 DIAGNOSIS — O34219 Maternal care for unspecified type scar from previous cesarean delivery: Secondary | ICD-10-CM

## 2022-05-07 DIAGNOSIS — O285 Abnormal chromosomal and genetic finding on antenatal screening of mother: Secondary | ICD-10-CM | POA: Diagnosis not present

## 2022-05-07 DIAGNOSIS — O99013 Anemia complicating pregnancy, third trimester: Secondary | ICD-10-CM

## 2022-05-07 DIAGNOSIS — Z98891 History of uterine scar from previous surgery: Secondary | ICD-10-CM | POA: Diagnosis present

## 2022-05-07 DIAGNOSIS — D563 Thalassemia minor: Secondary | ICD-10-CM | POA: Diagnosis not present

## 2022-05-07 DIAGNOSIS — Z3A32 32 weeks gestation of pregnancy: Secondary | ICD-10-CM

## 2022-05-08 ENCOUNTER — Other Ambulatory Visit: Payer: Self-pay | Admitting: *Deleted

## 2022-05-08 DIAGNOSIS — O99019 Anemia complicating pregnancy, unspecified trimester: Secondary | ICD-10-CM

## 2022-05-08 DIAGNOSIS — Z86718 Personal history of other venous thrombosis and embolism: Secondary | ICD-10-CM

## 2022-05-08 DIAGNOSIS — R638 Other symptoms and signs concerning food and fluid intake: Secondary | ICD-10-CM

## 2022-05-11 ENCOUNTER — Other Ambulatory Visit: Payer: Self-pay | Admitting: Family Medicine

## 2022-05-11 DIAGNOSIS — Z86718 Personal history of other venous thrombosis and embolism: Secondary | ICD-10-CM

## 2022-05-13 ENCOUNTER — Ambulatory Visit: Payer: Medicaid Other | Admitting: *Deleted

## 2022-05-13 ENCOUNTER — Other Ambulatory Visit: Payer: Self-pay

## 2022-05-13 ENCOUNTER — Encounter: Payer: Self-pay | Admitting: Family Medicine

## 2022-05-13 ENCOUNTER — Other Ambulatory Visit: Payer: Self-pay | Admitting: *Deleted

## 2022-05-13 ENCOUNTER — Encounter: Payer: Self-pay | Admitting: *Deleted

## 2022-05-13 ENCOUNTER — Ambulatory Visit (INDEPENDENT_AMBULATORY_CARE_PROVIDER_SITE_OTHER): Payer: Medicaid Other | Admitting: Family Medicine

## 2022-05-13 ENCOUNTER — Ambulatory Visit: Payer: Medicaid Other | Attending: Obstetrics and Gynecology

## 2022-05-13 VITALS — BP 114/78 | HR 83

## 2022-05-13 VITALS — BP 119/82 | HR 88 | Wt 189.7 lb

## 2022-05-13 DIAGNOSIS — D5720 Sickle-cell/Hb-C disease without crisis: Secondary | ICD-10-CM

## 2022-05-13 DIAGNOSIS — Z3689 Encounter for other specified antenatal screening: Secondary | ICD-10-CM | POA: Insufficient documentation

## 2022-05-13 DIAGNOSIS — D571 Sickle-cell disease without crisis: Secondary | ICD-10-CM

## 2022-05-13 DIAGNOSIS — O09293 Supervision of pregnancy with other poor reproductive or obstetric history, third trimester: Secondary | ICD-10-CM | POA: Diagnosis not present

## 2022-05-13 DIAGNOSIS — D563 Thalassemia minor: Secondary | ICD-10-CM

## 2022-05-13 DIAGNOSIS — O0990 Supervision of high risk pregnancy, unspecified, unspecified trimester: Secondary | ICD-10-CM | POA: Diagnosis present

## 2022-05-13 DIAGNOSIS — O99213 Obesity complicating pregnancy, third trimester: Secondary | ICD-10-CM

## 2022-05-13 DIAGNOSIS — Z86718 Personal history of other venous thrombosis and embolism: Secondary | ICD-10-CM

## 2022-05-13 DIAGNOSIS — O99019 Anemia complicating pregnancy, unspecified trimester: Secondary | ICD-10-CM | POA: Insufficient documentation

## 2022-05-13 DIAGNOSIS — Z98891 History of uterine scar from previous surgery: Secondary | ICD-10-CM

## 2022-05-13 DIAGNOSIS — O99013 Anemia complicating pregnancy, third trimester: Secondary | ICD-10-CM | POA: Diagnosis not present

## 2022-05-13 DIAGNOSIS — E669 Obesity, unspecified: Secondary | ICD-10-CM

## 2022-05-13 DIAGNOSIS — O34219 Maternal care for unspecified type scar from previous cesarean delivery: Secondary | ICD-10-CM

## 2022-05-13 DIAGNOSIS — Z3A33 33 weeks gestation of pregnancy: Secondary | ICD-10-CM

## 2022-05-13 DIAGNOSIS — O285 Abnormal chromosomal and genetic finding on antenatal screening of mother: Secondary | ICD-10-CM

## 2022-05-13 MED ORDER — ENOXAPARIN SODIUM 40 MG/0.4ML IJ SOSY: 40.0000 mg | PREFILLED_SYRINGE | INTRAMUSCULAR | 3 refills | Status: DC

## 2022-05-13 NOTE — Progress Notes (Signed)
   Subjective:  Sonya Tapia is a 31 y.o. G2P1001 at [redacted]w[redacted]d being seen today for ongoing prenatal care.  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  Patient reports no complaints.  Contractions: Not present. Vag. Bleeding: None.  Movement: Present. Denies leaking of fluid.   The following portions of the patient's history were reviewed and updated as appropriate: allergies, current medications, past family history, past medical history, past social history, past surgical history and problem list. Problem list updated.  Objective:   Vitals:   05/13/22 0956  BP: 119/82  Pulse: 88  Weight: 189 lb 11.2 oz (86 kg)    Fetal Status: Fetal Heart Rate (bpm): 136   Movement: Present     General:  Alert, oriented and cooperative. Patient is in no acute distress.  Skin: Skin is warm and dry. No rash noted.   Cardiovascular: Normal heart rate noted  Respiratory: Normal respiratory effort, no problems with respiration noted  Abdomen: Soft, gravid, appropriate for gestational age. Pain/Pressure: Absent     Pelvic: Vag. Bleeding: None     Cervical exam deferred        Extremities: Normal range of motion.     Mental Status: Normal mood and affect. Normal behavior. Normal judgment and thought content.   Urinalysis:      Assessment and Plan:  Pregnancy: G2P1001 at [redacted]w[redacted]d  1. Supervision of high risk pregnancy, antepartum BP and FHR normal  2. History of DVT (deep vein thrombosis) Provoked in setting of OCP's and Covid On Lovenox 40 SQ and ASA  3. Previous cesarean section Desires TOLAC Previously signed consent but provider did not sign Resigned paperwork  4. Sickle cell-hemoglobin  C disease without crisis (HCC) Noted on prenatal labs Lab Results  Component Value Date   HGB 10.0 (L) 04/10/2022     5. Alpha thalassemia silent carrier   Preterm labor symptoms and general obstetric precautions including but not limited to vaginal bleeding, contractions, leaking of fluid and fetal movement were reviewed in detail with the patient. Please refer to After Visit Summary for other counseling recommendations.  Return in 2 weeks (on 05/27/2022) for [facility], ob visit.   Venora Maples, MD

## 2022-05-21 ENCOUNTER — Ambulatory Visit: Payer: Medicaid Other | Attending: Obstetrics and Gynecology

## 2022-05-21 ENCOUNTER — Ambulatory Visit: Payer: Medicaid Other | Admitting: *Deleted

## 2022-05-21 VITALS — BP 119/78 | HR 80

## 2022-05-21 DIAGNOSIS — O99019 Anemia complicating pregnancy, unspecified trimester: Secondary | ICD-10-CM | POA: Diagnosis present

## 2022-05-21 DIAGNOSIS — O99213 Obesity complicating pregnancy, third trimester: Secondary | ICD-10-CM | POA: Diagnosis not present

## 2022-05-21 DIAGNOSIS — O99013 Anemia complicating pregnancy, third trimester: Secondary | ICD-10-CM | POA: Diagnosis not present

## 2022-05-21 DIAGNOSIS — D571 Sickle-cell disease without crisis: Secondary | ICD-10-CM | POA: Diagnosis not present

## 2022-05-21 DIAGNOSIS — Z98891 History of uterine scar from previous surgery: Secondary | ICD-10-CM | POA: Insufficient documentation

## 2022-05-21 DIAGNOSIS — O0990 Supervision of high risk pregnancy, unspecified, unspecified trimester: Secondary | ICD-10-CM

## 2022-05-21 DIAGNOSIS — E669 Obesity, unspecified: Secondary | ICD-10-CM

## 2022-05-21 DIAGNOSIS — Z3689 Encounter for other specified antenatal screening: Secondary | ICD-10-CM | POA: Insufficient documentation

## 2022-05-21 DIAGNOSIS — Z3A34 34 weeks gestation of pregnancy: Secondary | ICD-10-CM

## 2022-05-21 DIAGNOSIS — Z86718 Personal history of other venous thrombosis and embolism: Secondary | ICD-10-CM | POA: Diagnosis present

## 2022-05-21 DIAGNOSIS — D563 Thalassemia minor: Secondary | ICD-10-CM

## 2022-05-21 DIAGNOSIS — O34219 Maternal care for unspecified type scar from previous cesarean delivery: Secondary | ICD-10-CM

## 2022-05-26 ENCOUNTER — Telehealth: Payer: Self-pay | Admitting: *Deleted

## 2022-05-26 NOTE — Telephone Encounter (Signed)
Patient left a voice message today stating she just found out her case worker [name] had left and that [name] had said she could help her with some baby things. Wants to know if we can assign another case worker and have them call her. Today Pregnancy navigators have already left as of 3:18pm. Will leave in inbox to follow up tomorrow. Nancy Fetter

## 2022-05-28 ENCOUNTER — Telehealth (INDEPENDENT_AMBULATORY_CARE_PROVIDER_SITE_OTHER): Payer: Medicaid Other | Admitting: Family Medicine

## 2022-05-28 ENCOUNTER — Telehealth: Payer: Medicaid Other | Admitting: Family Medicine

## 2022-05-28 VITALS — BP 138/102 | HR 76

## 2022-05-28 DIAGNOSIS — Z3A35 35 weeks gestation of pregnancy: Secondary | ICD-10-CM

## 2022-05-28 DIAGNOSIS — O0990 Supervision of high risk pregnancy, unspecified, unspecified trimester: Secondary | ICD-10-CM

## 2022-05-28 DIAGNOSIS — O99013 Anemia complicating pregnancy, third trimester: Secondary | ICD-10-CM

## 2022-05-28 DIAGNOSIS — O0993 Supervision of high risk pregnancy, unspecified, third trimester: Secondary | ICD-10-CM

## 2022-05-28 DIAGNOSIS — O34219 Maternal care for unspecified type scar from previous cesarean delivery: Secondary | ICD-10-CM

## 2022-05-28 DIAGNOSIS — Z86718 Personal history of other venous thrombosis and embolism: Secondary | ICD-10-CM

## 2022-05-28 DIAGNOSIS — R030 Elevated blood-pressure reading, without diagnosis of hypertension: Secondary | ICD-10-CM

## 2022-05-28 DIAGNOSIS — O9913 Other diseases of the blood and blood-forming organs and certain disorders involving the immune mechanism complicating the puerperium: Secondary | ICD-10-CM

## 2022-05-28 DIAGNOSIS — D563 Thalassemia minor: Secondary | ICD-10-CM

## 2022-05-28 DIAGNOSIS — O352XX0 Maternal care for (suspected) hereditary disease in fetus, not applicable or unspecified: Secondary | ICD-10-CM

## 2022-05-28 DIAGNOSIS — D571 Sickle-cell disease without crisis: Secondary | ICD-10-CM

## 2022-05-28 DIAGNOSIS — Z98891 History of uterine scar from previous surgery: Secondary | ICD-10-CM

## 2022-05-28 DIAGNOSIS — O164 Unspecified maternal hypertension, complicating childbirth: Secondary | ICD-10-CM

## 2022-05-28 NOTE — Progress Notes (Signed)
I connected with  Sonya Tapia on 05/28/22 at  8:35 AM EDT by telephone and verified that I am speaking with the correct person using two identifiers.   I discussed the limitations, risks, security and privacy concerns of performing an evaluation and management service by telephone and the availability of in person appointments. I also discussed with the patient that there may be a patient responsible charge related to this service. The patient expressed understanding and agreed to proceed.   Patient reports good fetal movement no issues or concerns at this time. Discussed at next visit that she will be getting 36/38 week swabs completed. Expressed understanding. Patient is also needing to be put in contact with a pregnancy navigator. Previously seen [name] who is no longer at our office.  [name], CMA 05/28/2022  8:22 AM

## 2022-05-28 NOTE — Progress Notes (Signed)
I connected with Sonya Tapia 05/28/22 at  8:35 AM EDT by: MyChart video and verified that I am speaking with the correct person using two identifiers.  Patient is located at home and provider is located at [facility].     I discussed the limitations, risks, security and privacy concerns of performing an evaluation and management service by MyChart video and the availability of in person appointments. I also discussed with the patient that there may be a patient responsible charge related to this service. By engaging in this virtual visit, you consent to the provision of healthcare.  Additionally, you authorize for your insurance to be billed for the services provided during this visit.  The patient expressed understanding and agreed to proceed.  The following staff members participated in the virtual visit:  Denyce Robert    PRENATAL VISIT NOTE  Subjective:  Sonya Tapia is a 31 y.o. G2P1001 at [redacted]w[redacted]d  for virtual visit for ongoing prenatal care.  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  Patient reports no complaints.  Contractions: Not present. Vag. Bleeding: None.  Movement: Present. Denies leaking of fluid.   The following portions of the patient's history were reviewed and updated as appropriate: allergies, current medications, past family history, past medical history, past social history, past surgical history and problem list.   Objective:   Vitals:   05/28/22 0833 05/28/22 0910  BP: (!) 132/98 (!) 138/102  Pulse: 76    Self-Obtained  Fetal Status:     Movement: Present     Assessment and Plan:  Pregnancy: G2P1001 at [redacted]w[redacted]d 1. Alpha thalassemia silent carrier  2.  Previous cesarean section Plans on VBAC, signed consent  3. Supervision of high risk pregnancy, antepartum UTD Discussed timing of delivery given Sickle Cells disease. Reviewed recommendation of IOL at 39 weeks, patient desires to go into spontaneous labor and voiced understanding about increasing risk of stillbirth after 39 weeks. Patient would like to consider IOL late in 39th week to increase likelihood of SOL.   4. Sickle cell anemia of mother during pregnancy [facility]) Scheduled ante testing  5. History of DVT (deep vein thrombosis) On lovenox and ASA Reviewed she will stop lovenox 24 hr prior to IOL or CS  6. Elevated Blood pressure - Patient remains elevated -discussed risk factors for PEC/gHTN  - Reviewed my concerns about BO and s/sx of PEC in detail - Patient had visit next week  - She will check BP again today and sent them to the office - Reviewed expectation that we might be labs next week (CMP/CBC and Urine protein)  Preterm labor symptoms and general obstetric precautions including but not limited to vaginal bleeding, contractions, leaking of fluid and fetal movement were reviewed in detail with the patient.  Return in about 1 week (around 06/04/2022) for Routine prenatal care.  Future Appointments  Date Time Provider Department Center  05/29/2022  7:15 AM [facility] NURSE [facility]  05/29/2022  7:30 AM [facility] US2 [facility]  06/04/2022  1:15 PM Milas Hock, MD [facility]  06/04/2022  3:30 PM [facility] NURSE [facility]  06/04/2022  3:45 PM [facility] US4 [facility]  06/11/2022  1:15 PM Milas Hock, MD [facility]  06/11/2022  3:30 PM [facility] NURSE  [facility]  06/11/2022  3:45 PM [facility] US4 [facility]  06/18/2022  3:15 PM Federico Flake, MD [facility]  06/24/2022 10:55 AM Federico Flake, MD [facility]  07/01/2022 10:15 AM Kathlene Cote [facility]  07/02/2022 11:15 AM [facility] NST [facility]     Time spent on virtual visit: 15  minutes  Federico Flake, MD

## 2022-05-28 NOTE — Telephone Encounter (Signed)
Notified [name], Pregnancy Navigator, to reach out to pt.  [name] agreed.    Sonya Tapia  05/28/22

## 2022-05-29 ENCOUNTER — Ambulatory Visit: Payer: Medicaid Other | Attending: Obstetrics and Gynecology

## 2022-05-29 ENCOUNTER — Ambulatory Visit: Payer: Medicaid Other | Admitting: *Deleted

## 2022-05-29 VITALS — BP 128/89 | HR 76

## 2022-05-29 DIAGNOSIS — O0990 Supervision of high risk pregnancy, unspecified, unspecified trimester: Secondary | ICD-10-CM | POA: Insufficient documentation

## 2022-05-29 DIAGNOSIS — R638 Other symptoms and signs concerning food and fluid intake: Secondary | ICD-10-CM | POA: Diagnosis present

## 2022-05-29 DIAGNOSIS — Z98891 History of uterine scar from previous surgery: Secondary | ICD-10-CM | POA: Diagnosis present

## 2022-05-29 DIAGNOSIS — D563 Thalassemia minor: Secondary | ICD-10-CM

## 2022-05-29 DIAGNOSIS — O285 Abnormal chromosomal and genetic finding on antenatal screening of mother: Secondary | ICD-10-CM

## 2022-05-29 DIAGNOSIS — Z86718 Personal history of other venous thrombosis and embolism: Secondary | ICD-10-CM

## 2022-05-29 DIAGNOSIS — E669 Obesity, unspecified: Secondary | ICD-10-CM

## 2022-05-29 DIAGNOSIS — Z148 Genetic carrier of other disease: Secondary | ICD-10-CM

## 2022-05-29 DIAGNOSIS — D571 Sickle-cell disease without crisis: Secondary | ICD-10-CM

## 2022-05-29 DIAGNOSIS — Z3A35 35 weeks gestation of pregnancy: Secondary | ICD-10-CM

## 2022-05-29 DIAGNOSIS — O34219 Maternal care for unspecified type scar from previous cesarean delivery: Secondary | ICD-10-CM | POA: Diagnosis not present

## 2022-05-29 DIAGNOSIS — O09893 Supervision of other high risk pregnancies, third trimester: Secondary | ICD-10-CM

## 2022-05-29 DIAGNOSIS — O133 Gestational [pregnancy-induced] hypertension without significant proteinuria, third trimester: Secondary | ICD-10-CM

## 2022-05-29 DIAGNOSIS — O99019 Anemia complicating pregnancy, unspecified trimester: Secondary | ICD-10-CM | POA: Insufficient documentation

## 2022-05-29 DIAGNOSIS — O99013 Anemia complicating pregnancy, third trimester: Secondary | ICD-10-CM | POA: Diagnosis not present

## 2022-05-29 DIAGNOSIS — O99213 Obesity complicating pregnancy, third trimester: Secondary | ICD-10-CM

## 2022-06-01 NOTE — Progress Notes (Unsigned)
   PRENATAL VISIT NOTE  Subjective:  Sonya Tapia is a 31 y.o. G2P1001 at [redacted]w[redacted]d being seen today for ongoing prenatal care.  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  Patient reports no complaints.  Contractions: Not present. Vag. Bleeding: None.  Movement: Present. Denies leaking of fluid.   The following portions of the patient's history were reviewed and updated as appropriate: allergies, current medications, past family history, past medical history, past social history, past surgical history and problem list.   Objective:   Vitals:   06/04/22 1333 06/04/22 1406  BP: (!) 139/105 (!) 140/96  Pulse: 85 87  Weight: 199 lb (90.3 kg)     Fetal Status: Fetal Heart Rate (bpm): 155 Fundal Height: 37 cm Movement: Present  Presentation: Vertex  General:  Alert, oriented and cooperative. Patient is in no acute distress.  Skin: Skin is warm and dry. No rash noted.   Cardiovascular: Normal heart rate noted  Respiratory: Normal respiratory effort, no problems with respiration noted  Abdomen: Soft, gravid, appropriate for gestational age.  Pain/Pressure: Absent     Pelvic: Cervical exam performed in the presence of a chaperone Dilation: 1 Effacement (%): Thick Station: -2  Extremities: Normal range of motion.  Edema: None  Mental Status: Normal mood and affect. Normal behavior. Normal judgment and thought content.   Assessment and Plan:  Pregnancy: G2P1001 at [redacted]w[redacted]d 1. Supervision of high risk pregnancy, antepartum Will do cultures today but will also collect pap smear. Reviewed records from prior OB/GYN and they write that last pap was 03/2018.  Recheck BP was 140/96 - she will go  to MAU for preE eval and BP monitoring. If Bps there are show >140/90s, she know we will recommend IOL at 37w.   2. Previous cesarean section Desires tolac. Prior c-section due to NRFHT due to prolonged decels for 8lb1oz baby - she had presented in labor and had been augmented. Consent reviewed and signed at prior visit.   3. History of DVT (deep vein thrombosis) Provoked on OCPs. On prophylactic lovenox.   4. Sickle Cell disease Has antenatal testing arranged for weekly.  Growth is today following this appointment.   Preterm labor symptoms and general obstetric precautions including but not limited to vaginal bleeding, contractions, leaking of fluid and fetal movement were reviewed in detail with the patient. Please refer to After Visit Summary for other counseling recommendations.   Return in about 1 week (around 06/11/2022).  Future Appointments  Date Time Provider Department Center  06/04/2022  3:30 PM [facility] NURSE [facility]  06/04/2022  3:45 PM [facility] US4 [facility]  06/11/2022  1:15 PM Milas Hock, MD [facility]  06/11/2022  3:30 PM [facility] NURSE [facility]  06/11/2022  3:45 PM [facility] US4 [facility]  06/18/2022  3:15 PM Federico Flake, MD [facility]  06/24/2022 10:55 AM Federico Flake, MD [facility]  07/01/2022 10:15 AM Kathlene Cote [facility]  07/02/2022 11:15 AM [facility] NST [facility]    Milas Hock, MD

## 2022-06-04 ENCOUNTER — Ambulatory Visit (INDEPENDENT_AMBULATORY_CARE_PROVIDER_SITE_OTHER): Payer: Medicaid Other | Admitting: Obstetrics and Gynecology

## 2022-06-04 ENCOUNTER — Other Ambulatory Visit: Payer: Self-pay

## 2022-06-04 ENCOUNTER — Encounter: Payer: Self-pay | Admitting: Obstetrics and Gynecology

## 2022-06-04 ENCOUNTER — Inpatient Hospital Stay (HOSPITAL_COMMUNITY)
Admission: AD | Admit: 2022-06-04 | Discharge: 2022-06-04 | Disposition: A | Discharge status: Home / Self Care | Payer: Medicaid Other | Attending: Obstetrics & Gynecology | Admitting: Obstetrics & Gynecology

## 2022-06-04 ENCOUNTER — Encounter (HOSPITAL_COMMUNITY): Payer: Self-pay | Admitting: Obstetrics & Gynecology

## 2022-06-04 ENCOUNTER — Ambulatory Visit: Payer: Medicaid Other | Attending: Obstetrics and Gynecology

## 2022-06-04 ENCOUNTER — Other Ambulatory Visit (HOSPITAL_COMMUNITY)
Admission: RE | Admit: 2022-06-04 | Discharge: 2022-06-04 | Disposition: A | Discharge status: Home / Self Care | Payer: Medicaid Other | Source: Ambulatory Visit | Attending: Obstetrics and Gynecology | Admitting: Obstetrics and Gynecology

## 2022-06-04 ENCOUNTER — Ambulatory Visit: Payer: Medicaid Other | Admitting: *Deleted

## 2022-06-04 VITALS — BP 123/84 | HR 112

## 2022-06-04 VITALS — BP 140/96 | HR 87 | Wt 199.0 lb

## 2022-06-04 DIAGNOSIS — O99213 Obesity complicating pregnancy, third trimester: Secondary | ICD-10-CM

## 2022-06-04 DIAGNOSIS — Z3A36 36 weeks gestation of pregnancy: Secondary | ICD-10-CM

## 2022-06-04 DIAGNOSIS — O0990 Supervision of high risk pregnancy, unspecified, unspecified trimester: Secondary | ICD-10-CM | POA: Insufficient documentation

## 2022-06-04 DIAGNOSIS — R638 Other symptoms and signs concerning food and fluid intake: Secondary | ICD-10-CM | POA: Insufficient documentation

## 2022-06-04 DIAGNOSIS — Z86718 Personal history of other venous thrombosis and embolism: Secondary | ICD-10-CM

## 2022-06-04 DIAGNOSIS — O99019 Anemia complicating pregnancy, unspecified trimester: Secondary | ICD-10-CM | POA: Diagnosis present

## 2022-06-04 DIAGNOSIS — Z98891 History of uterine scar from previous surgery: Secondary | ICD-10-CM | POA: Insufficient documentation

## 2022-06-04 DIAGNOSIS — D571 Sickle-cell disease without crisis: Secondary | ICD-10-CM

## 2022-06-04 DIAGNOSIS — O133 Gestational [pregnancy-induced] hypertension without significant proteinuria, third trimester: Secondary | ICD-10-CM

## 2022-06-04 DIAGNOSIS — O99013 Anemia complicating pregnancy, third trimester: Secondary | ICD-10-CM

## 2022-06-04 DIAGNOSIS — O09293 Supervision of pregnancy with other poor reproductive or obstetric history, third trimester: Secondary | ICD-10-CM

## 2022-06-04 DIAGNOSIS — O34219 Maternal care for unspecified type scar from previous cesarean delivery: Secondary | ICD-10-CM

## 2022-06-04 DIAGNOSIS — D563 Thalassemia minor: Secondary | ICD-10-CM | POA: Diagnosis present

## 2022-06-04 DIAGNOSIS — O34211 Maternal care for low transverse scar from previous cesarean delivery: Secondary | ICD-10-CM | POA: Insufficient documentation

## 2022-06-04 DIAGNOSIS — D5720 Sickle-cell/Hb-C disease without crisis: Secondary | ICD-10-CM | POA: Diagnosis not present

## 2022-06-04 DIAGNOSIS — E669 Obesity, unspecified: Secondary | ICD-10-CM

## 2022-06-04 LAB — URINALYSIS, ROUTINE W REFLEX MICROSCOPIC
Bilirubin Urine: NEGATIVE
Glucose, UA: NEGATIVE mg/dL
Hgb urine dipstick: NEGATIVE
Ketones, ur: NEGATIVE mg/dL
Nitrite: NEGATIVE
Protein, ur: NEGATIVE mg/dL
Specific Gravity, Urine: 1.01 (ref 1.005–1.030)
pH: 6 (ref 5.0–8.0)

## 2022-06-04 LAB — COMPREHENSIVE METABOLIC PANEL
ALT: 11 U/L (ref 0–44)
AST: 22 U/L (ref 15–41)
Albumin: 2.7 g/dL — ABNORMAL LOW (ref 3.5–5.0)
Alkaline Phosphatase: 112 U/L (ref 38–126)
Anion gap: 10 (ref 5–15)
BUN: <5 mg/dL — ABNORMAL LOW (ref 6–20)
CO2: 22 mmol/L (ref 22–32)
Calcium: 9.1 mg/dL (ref 8.9–10.3)
Chloride: 107 mmol/L (ref 98–111)
Creatinine, Ser: 0.86 mg/dL (ref 0.44–1.00)
GFR, Estimated: >60 mL/min (ref >60)
Glucose, Bld: 92 mg/dL (ref 70–99)
Potassium: 3.8 mmol/L (ref 3.5–5.1)
Sodium: 139 mmol/L (ref 135–145)
Total Bilirubin: 1 mg/dL (ref 0.3–1.2)
Total Protein: 6.1 g/dL — ABNORMAL LOW (ref 6.5–8.1)

## 2022-06-04 LAB — CBC
HCT: 28 % — ABNORMAL LOW (ref 36.0–46.0)
Hemoglobin: 10.2 g/dL — ABNORMAL LOW (ref 12.0–15.0)
MCH: 29 pg (ref 26.0–34.0)
MCHC: 36.4 g/dL — ABNORMAL HIGH (ref 30.0–36.0)
MCV: 79.5 fL — ABNORMAL LOW (ref 80.0–100.0)
Platelets: 90 10*3/uL — ABNORMAL LOW (ref 150–400)
RBC: 3.52 MIL/uL — ABNORMAL LOW (ref 3.87–5.11)
RDW: 15.2 % (ref 11.5–15.5)
WBC: 6.6 10*3/uL (ref 4.0–10.5)
nRBC: 0.3 % — ABNORMAL HIGH (ref 0.0–0.2)

## 2022-06-04 LAB — PROTEIN / CREATININE RATIO, URINE
Creatinine, Urine: 111 mg/dL
Protein Creatinine Ratio: 0.14 mg/mg{Cre} (ref 0.00–0.15)
Total Protein, Urine: 15 mg/dL

## 2022-06-04 NOTE — MAU Note (Signed)
.  Meranda Dechaine is a 31 y.o. at [redacted]w[redacted]d here in MAU reporting: sent from office for elevated b/p. Denies h/s  or visual changes. Slight swelling to feet and ankles. Reports good fetal movement.Denies any vag bleeding or leaking.   Onset of complaint: today Pain score: 0 Vitals:   06/04/22 1727  BP: 126/87  Pulse: 92  Resp: 18  Temp: 98.2 F (36.8 C)     FHT:135 Lab orders placed from triage:  PIH labs pr provider.

## 2022-06-04 NOTE — MAU Provider Note (Signed)
History     CSN: [id number]  Arrival date and time: 06/04/22 1659    Chief Complaint  Patient presents with   Hypertension   HPI  Sonya Tapia is a 31 y.o. G2P1001 at [redacted]w[redacted]d who presents to MAU for pre-eclampsia workup. Patient had 2 elevated BP's in the office today. She denies headache, vision changes, RUQ/epigastric pain, or significant swelling. No contractions, vaginal bleeding, or leaking fluid. She endorses active fetal movement.   Patient receives prenatal care at [facility]. Previous pregnancy significant for pLTCS in the setting of NRFHT's. She desires TOLAC.   OB History     Gravida  2   Para  1   Term  1   Preterm      AB      Living  1      SAB      IAB      Ectopic      Multiple      Live Births  1           Past Medical History:  Diagnosis Date   DVT (deep venous thrombosis) (HCC)    Sickle cell anemia (HCC)     Past Surgical History:  Procedure Laterality Date   CESAREAN SECTION      Family History  Problem Relation Age of Onset   Hypertension Mother    Healthy Father     Social History   Tobacco Use   Smoking status: Never   Smokeless tobacco: Never  Vaping Use   Vaping Use: Never used  Substance Use Topics   Alcohol use: Not Currently    Comment: not while preg   Drug use: Never    Allergies: No Known Allergies  No medications prior to admission.    Review of Systems  Constitutional: Negative.   Respiratory: Negative.    Cardiovascular: Negative.   Gastrointestinal: Negative.   Genitourinary: Negative.   Neurological: Negative.    Physical Exam  Patient Vitals for the past 24 hrs:  BP Temp Pulse Resp  06/04/22 1946 124/86 -- 76 --  06/04/22 1930 131/87 -- 82 --  06/04/22 1915 122/87 -- 90 --  06/04/22 1901 128/87 -- 85 --  06/04/22 1846 111/74 -- 79 --  06/04/22 1831 (!) 126/93 -- 96 --  06/04/22 1816 125/88 -- 90 --  06/04/22 1801 124/89 -- 95 --  06/04/22 1746 131/90 -- 84 --  06/04/22 1731  125/83 -- 91 --  06/04/22 1727 126/87 98.2 F (36.8 C) 92 18   Physical Exam Vitals and nursing note reviewed.  Constitutional:      General: She is not in acute distress. Eyes:     Extraocular Movements: Extraocular movements intact.     Pupils: Pupils are equal, round, and reactive to light.  Cardiovascular:     Rate and Rhythm: Normal rate.  Pulmonary:     Effort: Pulmonary effort is normal.  Abdominal:     Palpations: Abdomen is soft.     Tenderness: There is no abdominal tenderness.     Comments: Gravid   Musculoskeletal:        General: Normal range of motion.  Skin:    General: Skin is warm and dry.  Neurological:     General: No focal deficit present.     Mental Status: She is alert and oriented to person, place, and time.  Psychiatric:        Mood and Affect: Mood normal.        Behavior: Behavior  normal.        Judgment: Judgment normal.    NST FHR: 135 bpm, moderate variability, +15x15 accels, no decels Toco: ui  MAU Course  Procedures NST  MDM CBC, CMP, UPCR Serial BP's  NST reactive and reassuring for gestational age. Toco with occasional ui, patient unaware.   Labs overall normal with the exception of the platelet count which is 90. Patient is noted to have low platelets on previous lab results with last count being 129 last month.  BP's normotensive with exception of 1 mild range BP. Patient is asymptomatic. She prefers not to be induced at 37 weeks unless medically necessary. I reviewed patient, findings and labs with Dr. Debroah Loop who agrees that patient can be discharged home with BP check on Friday and strict return precautions. Message sent to [facility] to schedule BP check on Friday and plan of care can be determined at that time. Patient was given strict return precautions   Assessment and Plan  [redacted] weeks gestation of pregnancy Gestational hypertension  - Discharge home in stable condition - BP check on Friday at [facility] - Strict return precautions -  Return to MAU sooner or as needed for new/worsening symptoms    Brand Males, CNM 06/04/2022, 8:29 PM

## 2022-06-05 ENCOUNTER — Other Ambulatory Visit: Payer: Self-pay | Admitting: *Deleted

## 2022-06-05 DIAGNOSIS — Z86718 Personal history of other venous thrombosis and embolism: Secondary | ICD-10-CM

## 2022-06-05 DIAGNOSIS — D571 Sickle-cell disease without crisis: Secondary | ICD-10-CM

## 2022-06-05 LAB — GC/CHLAMYDIA PROBE AMP (~~LOC~~) NOT AT ARMC
Chlamydia: NEGATIVE
Comment: NEGATIVE
Comment: NORMAL
Neisseria Gonorrhea: NEGATIVE

## 2022-06-06 ENCOUNTER — Encounter: Payer: Self-pay | Admitting: Family Medicine

## 2022-06-06 ENCOUNTER — Other Ambulatory Visit: Payer: Self-pay

## 2022-06-06 ENCOUNTER — Inpatient Hospital Stay (HOSPITAL_COMMUNITY)
Admission: AD | Admit: 2022-06-06 | Payer: Medicaid Other | Source: Home / Self Care | Admitting: Obstetrics & Gynecology

## 2022-06-06 ENCOUNTER — Ambulatory Visit (INDEPENDENT_AMBULATORY_CARE_PROVIDER_SITE_OTHER): Payer: Medicaid Other | Admitting: *Deleted

## 2022-06-06 VITALS — BP 130/91 | HR 85

## 2022-06-06 DIAGNOSIS — O133 Gestational [pregnancy-induced] hypertension without significant proteinuria, third trimester: Secondary | ICD-10-CM

## 2022-06-06 NOTE — Progress Notes (Signed)
Pt presents for BP check 144/97, P - 75.  Recheck after 10 minutes 130/91 P - 85. She denies H/A or visual disturbances. Discussed pt status with Dr. Para March who recommends direct admit today for IOL. She spoke w/pt regarding recommendation for plan of care. Pt was very resistant to the recommendation however ultimately agreed to go to hospital late tonight (12-2am) for admission.

## 2022-06-07 ENCOUNTER — Encounter (HOSPITAL_COMMUNITY): Payer: Self-pay | Admitting: Obstetrics & Gynecology

## 2022-06-07 ENCOUNTER — Other Ambulatory Visit: Payer: Self-pay

## 2022-06-07 ENCOUNTER — Inpatient Hospital Stay (HOSPITAL_COMMUNITY): Payer: Medicaid Other | Admitting: Anesthesiology

## 2022-06-07 ENCOUNTER — Inpatient Hospital Stay (HOSPITAL_COMMUNITY)
Admission: RE | Admit: 2022-06-07 | Discharge: 2022-06-10 | DRG: 787 | Disposition: A | Discharge status: Home / Self Care | Payer: Medicaid Other | Attending: Obstetrics and Gynecology | Admitting: Obstetrics and Gynecology

## 2022-06-07 ENCOUNTER — Inpatient Hospital Stay (HOSPITAL_COMMUNITY): Payer: Medicaid Other

## 2022-06-07 ENCOUNTER — Encounter (HOSPITAL_COMMUNITY)
Admission: RE | Disposition: A | Discharge status: Home / Self Care | Payer: Self-pay | Source: Home / Self Care | Attending: Obstetrics and Gynecology

## 2022-06-07 DIAGNOSIS — Z7901 Long term (current) use of anticoagulants: Secondary | ICD-10-CM

## 2022-06-07 DIAGNOSIS — O134 Gestational [pregnancy-induced] hypertension without significant proteinuria, complicating childbirth: Secondary | ICD-10-CM

## 2022-06-07 DIAGNOSIS — S3769XA Other injury of uterus, initial encounter: Secondary | ICD-10-CM | POA: Diagnosis not present

## 2022-06-07 DIAGNOSIS — Z8616 Personal history of COVID-19: Secondary | ICD-10-CM

## 2022-06-07 DIAGNOSIS — O9902 Anemia complicating childbirth: Secondary | ICD-10-CM | POA: Diagnosis present

## 2022-06-07 DIAGNOSIS — O9912 Other diseases of the blood and blood-forming organs and certain disorders involving the immune mechanism complicating childbirth: Secondary | ICD-10-CM | POA: Diagnosis present

## 2022-06-07 DIAGNOSIS — O139 Gestational [pregnancy-induced] hypertension without significant proteinuria, unspecified trimester: Secondary | ICD-10-CM | POA: Diagnosis present

## 2022-06-07 DIAGNOSIS — Z3A37 37 weeks gestation of pregnancy: Secondary | ICD-10-CM

## 2022-06-07 DIAGNOSIS — D563 Thalassemia minor: Secondary | ICD-10-CM | POA: Diagnosis present

## 2022-06-07 DIAGNOSIS — D696 Thrombocytopenia, unspecified: Secondary | ICD-10-CM | POA: Diagnosis present

## 2022-06-07 DIAGNOSIS — D571 Sickle-cell disease without crisis: Secondary | ICD-10-CM | POA: Diagnosis present

## 2022-06-07 DIAGNOSIS — O328XX0 Maternal care for other malpresentation of fetus, not applicable or unspecified: Secondary | ICD-10-CM | POA: Diagnosis present

## 2022-06-07 DIAGNOSIS — O34219 Maternal care for unspecified type scar from previous cesarean delivery: Secondary | ICD-10-CM | POA: Diagnosis present

## 2022-06-07 DIAGNOSIS — Z86718 Personal history of other venous thrombosis and embolism: Secondary | ICD-10-CM | POA: Diagnosis not present

## 2022-06-07 DIAGNOSIS — O34211 Maternal care for low transverse scar from previous cesarean delivery: Secondary | ICD-10-CM | POA: Diagnosis not present

## 2022-06-07 DIAGNOSIS — O4593 Premature separation of placenta, unspecified, third trimester: Secondary | ICD-10-CM | POA: Diagnosis not present

## 2022-06-07 DIAGNOSIS — Z8759 Personal history of other complications of pregnancy, childbirth and the puerperium: Secondary | ICD-10-CM | POA: Diagnosis present

## 2022-06-07 DIAGNOSIS — O0990 Supervision of high risk pregnancy, unspecified, unspecified trimester: Secondary | ICD-10-CM

## 2022-06-07 DIAGNOSIS — Z98891 History of uterine scar from previous surgery: Secondary | ICD-10-CM

## 2022-06-07 DIAGNOSIS — O135 Gestational [pregnancy-induced] hypertension without significant proteinuria, complicating the puerperium: Secondary | ICD-10-CM | POA: Diagnosis not present

## 2022-06-07 DIAGNOSIS — O99019 Anemia complicating pregnancy, unspecified trimester: Secondary | ICD-10-CM | POA: Diagnosis present

## 2022-06-07 HISTORY — DX: Gestational (pregnancy-induced) hypertension without significant proteinuria, unspecified trimester: O13.9

## 2022-06-07 LAB — CBC
HCT: 30.8 % — ABNORMAL LOW (ref 36.0–46.0)
HCT: 29.3 % — ABNORMAL LOW (ref 36.0–46.0)
HCT: 28.5 % — ABNORMAL LOW (ref 36.0–46.0)
HCT: 26.3 % — ABNORMAL LOW (ref 36.0–46.0)
Hemoglobin: 10.6 g/dL — ABNORMAL LOW (ref 12.0–15.0)
Hemoglobin: 10.2 g/dL — ABNORMAL LOW (ref 12.0–15.0)
Hemoglobin: 10.1 g/dL — ABNORMAL LOW (ref 12.0–15.0)
Hemoglobin: 9.3 g/dL — ABNORMAL LOW (ref 12.0–15.0)
MCH: 28.2 pg (ref 26.0–34.0)
MCH: 28.4 pg (ref 26.0–34.0)
MCH: 28.9 pg (ref 26.0–34.0)
MCH: 28.7 pg (ref 26.0–34.0)
MCHC: 34.4 g/dL (ref 30.0–36.0)
MCHC: 34.8 g/dL (ref 30.0–36.0)
MCHC: 35.4 g/dL (ref 30.0–36.0)
MCHC: 35.4 g/dL (ref 30.0–36.0)
MCV: 81.9 fL (ref 80.0–100.0)
MCV: 81.6 fL (ref 80.0–100.0)
MCV: 81.4 fL (ref 80.0–100.0)
MCV: 81.2 fL (ref 80.0–100.0)
Platelets: 97 10*3/uL — ABNORMAL LOW (ref 150–400)
Platelets: 87 10*3/uL — ABNORMAL LOW (ref 150–400)
Platelets: 82 10*3/uL — ABNORMAL LOW (ref 150–400)
Platelets: 96 10*3/uL — ABNORMAL LOW (ref 150–400)
RBC: 3.76 MIL/uL — ABNORMAL LOW (ref 3.87–5.11)
RBC: 3.59 MIL/uL — ABNORMAL LOW (ref 3.87–5.11)
RBC: 3.5 MIL/uL — ABNORMAL LOW (ref 3.87–5.11)
RBC: 3.24 MIL/uL — ABNORMAL LOW (ref 3.87–5.11)
RDW: 14.9 % (ref 11.5–15.5)
RDW: 14.9 % (ref 11.5–15.5)
RDW: 14.8 % (ref 11.5–15.5)
RDW: 14.8 % (ref 11.5–15.5)
WBC: 6.8 10*3/uL (ref 4.0–10.5)
WBC: 6.3 10*3/uL (ref 4.0–10.5)
WBC: 6.6 10*3/uL (ref 4.0–10.5)
WBC: 8.2 10*3/uL (ref 4.0–10.5)
nRBC: 0.3 % — ABNORMAL HIGH (ref 0.0–0.2)
nRBC: 0.3 % — ABNORMAL HIGH (ref 0.0–0.2)
nRBC: 0 % (ref 0.0–0.2)
nRBC: 0.2 % (ref 0.0–0.2)

## 2022-06-07 LAB — COMPREHENSIVE METABOLIC PANEL
ALT: 11 U/L (ref 0–44)
AST: 24 U/L (ref 15–41)
Albumin: 2.9 g/dL — ABNORMAL LOW (ref 3.5–5.0)
Alkaline Phosphatase: 124 U/L (ref 38–126)
Anion gap: 9 (ref 5–15)
BUN: <5 mg/dL — ABNORMAL LOW (ref 6–20)
CO2: 19 mmol/L — ABNORMAL LOW (ref 22–32)
Calcium: 8.8 mg/dL — ABNORMAL LOW (ref 8.9–10.3)
Chloride: 105 mmol/L (ref 98–111)
Creatinine, Ser: 0.93 mg/dL (ref 0.44–1.00)
GFR, Estimated: >60 mL/min (ref >60)
Glucose, Bld: 70 mg/dL (ref 70–99)
Potassium: 3.7 mmol/L (ref 3.5–5.1)
Sodium: 133 mmol/L — ABNORMAL LOW (ref 135–145)
Total Bilirubin: 1.7 mg/dL — ABNORMAL HIGH (ref 0.3–1.2)
Total Protein: 6.2 g/dL — ABNORMAL LOW (ref 6.5–8.1)

## 2022-06-07 LAB — PROTEIN / CREATININE RATIO, URINE
Creatinine, Urine: 115 mg/dL
Protein Creatinine Ratio: 0.12 mg/mg{Cre} (ref 0.00–0.15)
Total Protein, Urine: 14 mg/dL

## 2022-06-07 LAB — RPR: RPR Ser Ql: NONREACTIVE

## 2022-06-07 SURGERY — Surgical Case
Anesthesia: Epidural | Site: Abdomen | Wound class: "Clean Contaminated "

## 2022-06-07 MED ORDER — COCONUT OIL OIL: 1.0000 | TOPICAL_OIL | Status: DC | PRN

## 2022-06-07 MED ORDER — DIPHENHYDRAMINE HCL 50 MG/ML IJ SOLN: 12.5000 mg | INTRAMUSCULAR | Status: DC | PRN

## 2022-06-07 MED ORDER — CEFAZOLIN SODIUM-DEXTROSE 2-4 GM/100ML-% IV SOLN
INTRAVENOUS | Status: AC
  Filled 2022-06-07: qty 100

## 2022-06-07 MED ORDER — CEFAZOLIN SODIUM-DEXTROSE 2-3 GM-%(50ML) IV SOLR
INTRAVENOUS | Status: DC | PRN
  Administered 2022-06-07: 2 g via INTRAVENOUS

## 2022-06-07 MED ORDER — PHENYLEPHRINE HCL-NACL 20-0.9 MG/250ML-% IV SOLN
INTRAVENOUS | Status: DC | PRN
  Administered 2022-06-07: 50 ug/min via INTRAVENOUS

## 2022-06-07 MED ORDER — SODIUM CHLORIDE 0.9 % IV SOLN
INTRAVENOUS | Status: DC | PRN
  Administered 2022-06-07: 500 mg via INTRAVENOUS

## 2022-06-07 MED ORDER — OXYTOCIN-SODIUM CHLORIDE 30-0.9 UT/500ML-% IV SOLN
INTRAVENOUS | Status: AC
  Filled 2022-06-07: qty 500

## 2022-06-07 MED ORDER — SOD CITRATE-CITRIC ACID 500-334 MG/5ML PO SOLN
30.0000 mL | ORAL | Status: DC | PRN
  Filled 2022-06-07: qty 30

## 2022-06-07 MED ORDER — WITCH HAZEL-GLYCERIN EX PADS: 1.0000 | MEDICATED_PAD | CUTANEOUS | Status: DC | PRN

## 2022-06-07 MED ORDER — OXYTOCIN BOLUS FROM INFUSION: 333.0000 mL | Freq: Once | INTRAVENOUS | Status: DC

## 2022-06-07 MED ORDER — PHENYLEPHRINE 80 MCG/ML (10ML) SYRINGE FOR IV PUSH (FOR BLOOD PRESSURE SUPPORT)
PREFILLED_SYRINGE | INTRAVENOUS | Status: AC
  Filled 2022-06-07: qty 10

## 2022-06-07 MED ORDER — LIDOCAINE-EPINEPHRINE (PF) 2 %-1:200000 IJ SOLN
INTRAMUSCULAR | Status: DC | PRN
  Administered 2022-06-07: 5 mL via EPIDURAL

## 2022-06-07 MED ORDER — FENTANYL CITRATE (PF) 100 MCG/2ML IJ SOLN
INTRAMUSCULAR | Status: AC
  Filled 2022-06-07: qty 2

## 2022-06-07 MED ORDER — FENTANYL CITRATE (PF) 100 MCG/2ML IJ SOLN: 25.0000 ug | INTRAMUSCULAR | Status: DC | PRN

## 2022-06-07 MED ORDER — IBUPROFEN 600 MG PO TABS: 600.0000 mg | ORAL_TABLET | Freq: Four times a day (QID) | ORAL | Status: DC

## 2022-06-07 MED ORDER — OXYTOCIN-SODIUM CHLORIDE 30-0.9 UT/500ML-% IV SOLN
1.0000 m[IU]/min | INTRAVENOUS | Status: DC
  Administered 2022-06-07: 12 m[IU]/min via INTRAVENOUS
  Administered 2022-06-07: 30 [IU] via INTRAVENOUS

## 2022-06-07 MED ORDER — OXYTOCIN-SODIUM CHLORIDE 30-0.9 UT/500ML-% IV SOLN: 2.5000 [IU]/h | INTRAVENOUS | Status: AC

## 2022-06-07 MED ORDER — DIBUCAINE (PERIANAL) 1 % EX OINT: 1.0000 | TOPICAL_OINTMENT | CUTANEOUS | Status: DC | PRN

## 2022-06-07 MED ORDER — LIDOCAINE HCL (PF) 1 % IJ SOLN: 30.0000 mL | INTRAMUSCULAR | Status: DC | PRN

## 2022-06-07 MED ORDER — PHENYLEPHRINE 80 MCG/ML (10ML) SYRINGE FOR IV PUSH (FOR BLOOD PRESSURE SUPPORT): 80.0000 ug | PREFILLED_SYRINGE | INTRAVENOUS | Status: DC | PRN

## 2022-06-07 MED ORDER — DEXAMETHASONE SODIUM PHOSPHATE 10 MG/ML IJ SOLN
INTRAMUSCULAR | Status: DC | PRN
  Administered 2022-06-07: 5 mg via INTRAVENOUS

## 2022-06-07 MED ORDER — SENNOSIDES-DOCUSATE SODIUM 8.6-50 MG PO TABS
2.0000 | ORAL_TABLET | Freq: Every day | ORAL | Status: DC
  Administered 2022-06-08 – 2022-06-10 (×3): 2 via ORAL
  Filled 2022-06-07 (×3): qty 2

## 2022-06-07 MED ORDER — FENTANYL CITRATE (PF) 100 MCG/2ML IJ SOLN
INTRAMUSCULAR | Status: DC | PRN
  Administered 2022-06-07: 100 ug via INTRAVENOUS

## 2022-06-07 MED ORDER — OXYCODONE-ACETAMINOPHEN 5-325 MG PO TABS: 1.0000 | ORAL_TABLET | ORAL | Status: DC | PRN

## 2022-06-07 MED ORDER — ACETAMINOPHEN 500 MG PO TABS
1000.0000 mg | ORAL_TABLET | Freq: Four times a day (QID) | ORAL | Status: DC
  Administered 2022-06-08 – 2022-06-10 (×11): 1000 mg via ORAL
  Filled 2022-06-07 (×11): qty 2

## 2022-06-07 MED ORDER — SODIUM CHLORIDE 0.9 % IR SOLN
Status: DC | PRN
  Administered 2022-06-07: 1

## 2022-06-07 MED ORDER — STERILE WATER FOR IRRIGATION IR SOLN
Status: DC | PRN
  Administered 2022-06-07: 1

## 2022-06-07 MED ORDER — SODIUM BICARBONATE 8.4 % IV SOLN
INTRAVENOUS | Status: DC | PRN
  Administered 2022-06-07: 5 mL via EPIDURAL
  Administered 2022-06-07: 3 mL via EPIDURAL

## 2022-06-07 MED ORDER — ACETAMINOPHEN 10 MG/ML IV SOLN: 1000.0000 mg | Freq: Once | INTRAVENOUS | Status: DC | PRN

## 2022-06-07 MED ORDER — ENOXAPARIN SODIUM 40 MG/0.4ML IJ SOSY: 40.0000 mg | PREFILLED_SYRINGE | INTRAMUSCULAR | Status: DC

## 2022-06-07 MED ORDER — EPHEDRINE 5 MG/ML INJ: 10.0000 mg | INTRAVENOUS | Status: DC | PRN

## 2022-06-07 MED ORDER — LACTATED RINGERS IV SOLN: 500.0000 mL | Freq: Once | INTRAVENOUS | Status: AC

## 2022-06-07 MED ORDER — DEXAMETHASONE SODIUM PHOSPHATE 10 MG/ML IJ SOLN
INTRAMUSCULAR | Status: AC
  Filled 2022-06-07: qty 1

## 2022-06-07 MED ORDER — MORPHINE SULFATE (PF) 0.5 MG/ML IJ SOLN
INTRAMUSCULAR | Status: DC | PRN
  Administered 2022-06-07: 3 mg via EPIDURAL

## 2022-06-07 MED ORDER — MORPHINE SULFATE (PF) 0.5 MG/ML IJ SOLN
INTRAMUSCULAR | Status: AC
  Filled 2022-06-07: qty 10

## 2022-06-07 MED ORDER — PHENYLEPHRINE 80 MCG/ML (10ML) SYRINGE FOR IV PUSH (FOR BLOOD PRESSURE SUPPORT)
80.0000 ug | PREFILLED_SYRINGE | INTRAVENOUS | Status: DC | PRN
  Filled 2022-06-07: qty 10

## 2022-06-07 MED ORDER — LACTATED RINGERS IV SOLN: INTRAVENOUS | Status: DC

## 2022-06-07 MED ORDER — LIDOCAINE-EPINEPHRINE (PF) 2 %-1:200000 IJ SOLN
INTRAMUSCULAR | Status: AC
  Filled 2022-06-07: qty 20

## 2022-06-07 MED ORDER — LACTATED RINGERS IV SOLN: 500.0000 mL | INTRAVENOUS | Status: DC | PRN

## 2022-06-07 MED ORDER — KETOROLAC TROMETHAMINE 30 MG/ML IJ SOLN
30.0000 mg | Freq: Four times a day (QID) | INTRAMUSCULAR | Status: DC
  Administered 2022-06-08 (×2): 30 mg via INTRAVENOUS
  Filled 2022-06-07 (×2): qty 1

## 2022-06-07 MED ORDER — ONDANSETRON HCL 4 MG/2ML IJ SOLN
INTRAMUSCULAR | Status: AC
  Filled 2022-06-07: qty 2

## 2022-06-07 MED ORDER — OXYCODONE-ACETAMINOPHEN 5-325 MG PO TABS: 2.0000 | ORAL_TABLET | ORAL | Status: DC | PRN

## 2022-06-07 MED ORDER — OXYTOCIN-SODIUM CHLORIDE 30-0.9 UT/500ML-% IV SOLN
1.0000 m[IU]/min | INTRAVENOUS | Status: DC
  Administered 2022-06-07: 1 m[IU]/min via INTRAVENOUS
  Filled 2022-06-07: qty 500

## 2022-06-07 MED ORDER — OXYCODONE HCL 5 MG PO TABS: 5.0000 mg | ORAL_TABLET | ORAL | Status: DC | PRN

## 2022-06-07 MED ORDER — MENTHOL 3 MG MT LOZG: 1.0000 | LOZENGE | OROMUCOSAL | Status: DC | PRN

## 2022-06-07 MED ORDER — ONDANSETRON HCL 4 MG/2ML IJ SOLN
4.0000 mg | Freq: Four times a day (QID) | INTRAMUSCULAR | Status: DC | PRN
  Administered 2022-06-07: 4 mg via INTRAVENOUS
  Filled 2022-06-07: qty 2

## 2022-06-07 MED ORDER — FENTANYL-BUPIVACAINE-NACL 0.5-0.125-0.9 MG/250ML-% EP SOLN
12.0000 mL/h | EPIDURAL | Status: DC | PRN
  Administered 2022-06-07: 12 mL/h via EPIDURAL
  Filled 2022-06-07: qty 250

## 2022-06-07 MED ORDER — DIPHENHYDRAMINE HCL 25 MG PO CAPS: 25.0000 mg | ORAL_CAPSULE | Freq: Four times a day (QID) | ORAL | Status: DC | PRN

## 2022-06-07 MED ORDER — OXYTOCIN-SODIUM CHLORIDE 30-0.9 UT/500ML-% IV SOLN: 2.5000 [IU]/h | INTRAVENOUS | Status: DC

## 2022-06-07 MED ORDER — SIMETHICONE 80 MG PO CHEW
80.0000 mg | CHEWABLE_TABLET | Freq: Three times a day (TID) | ORAL | Status: DC
Start: 2022-06-08 — End: 2022-06-10
  Administered 2022-06-08 – 2022-06-10 (×4): 80 mg via ORAL
  Filled 2022-06-07 (×5): qty 1

## 2022-06-07 MED ORDER — ONDANSETRON HCL 4 MG/2ML IJ SOLN
INTRAMUSCULAR | Status: DC | PRN
  Administered 2022-06-07: 4 mg via INTRAVENOUS

## 2022-06-07 MED ORDER — ACETAMINOPHEN 325 MG PO TABS: 650.0000 mg | ORAL_TABLET | ORAL | Status: DC | PRN

## 2022-06-07 MED ORDER — FENTANYL CITRATE (PF) 100 MCG/2ML IJ SOLN
100.0000 ug | INTRAMUSCULAR | Status: DC | PRN
  Administered 2022-06-07: 100 ug via INTRAVENOUS

## 2022-06-07 MED ORDER — PHENYLEPHRINE HCL (PRESSORS) 10 MG/ML IV SOLN
INTRAVENOUS | Status: DC | PRN
  Administered 2022-06-07: 320 ug via INTRAVENOUS

## 2022-06-07 MED ORDER — LACTATED RINGERS IV SOLN
INTRAVENOUS | Status: DC
Start: 2022-06-08 — End: 2022-06-10

## 2022-06-07 MED ORDER — TERBUTALINE SULFATE 1 MG/ML IJ SOLN: 0.2500 mg | Freq: Once | INTRAMUSCULAR | Status: DC | PRN

## 2022-06-07 MED ORDER — PRENATAL MULTIVITAMIN CH
1.0000 | ORAL_TABLET | Freq: Every day | ORAL | Status: DC
  Administered 2022-06-09 – 2022-06-10 (×2): 1 via ORAL
  Filled 2022-06-07 (×2): qty 1

## 2022-06-07 MED ORDER — SIMETHICONE 80 MG PO CHEW: 80.0000 mg | CHEWABLE_TABLET | ORAL | Status: DC | PRN

## 2022-06-07 MED ORDER — SODIUM CHLORIDE 0.9 % IV SOLN
INTRAVENOUS | Status: AC
  Filled 2022-06-07: qty 5

## 2022-06-07 MED ORDER — PHENYLEPHRINE HCL-NACL 20-0.9 MG/250ML-% IV SOLN
INTRAVENOUS | Status: AC
  Filled 2022-06-07: qty 250

## 2022-06-07 SURGICAL SUPPLY — 35 items
APL SKNCLS STERI-STRIP NONHPOA (GAUZE/BANDAGES/DRESSINGS) ×1
BENZOIN TINCTURE PRP APPL 2/3 (GAUZE/BANDAGES/DRESSINGS) ×1 IMPLANT
CHLORAPREP W/TINT 26ML (MISCELLANEOUS) ×4 IMPLANT
CLAMP CORD UMBIL (MISCELLANEOUS) ×2 IMPLANT
CLOTH BEACON ORANGE TIMEOUT ST (SAFETY) ×2 IMPLANT
CLSR STERI-STRIP ANTIMIC 1/2X4 (GAUZE/BANDAGES/DRESSINGS) ×1 IMPLANT
DRSG OPSITE POSTOP 4X10 (GAUZE/BANDAGES/DRESSINGS) ×2 IMPLANT
ELECT REM PT RETURN 9FT ADLT (ELECTROSURGICAL) ×2
ELECTRODE REM PT RTRN 9FT ADLT (ELECTROSURGICAL) ×1 IMPLANT
EXTRACTOR VACUUM M CUP 4 TUBE (SUCTIONS) IMPLANT
GLOVE BIOGEL PI IND STRL 7.0 (GLOVE) ×3 IMPLANT
GLOVE BIOGEL PI INDICATOR 7.0 (GLOVE) ×3
GLOVE ECLIPSE 7.0 STRL STRAW (GLOVE) ×2 IMPLANT
GOWN STRL REUS W/TWL LRG LVL3 (GOWN DISPOSABLE) ×4 IMPLANT
HEMOSTAT ARISTA ABSORB 3G PWDR (HEMOSTASIS) ×1 IMPLANT
KIT ABG SYR 3ML LUER SLIP (SYRINGE) IMPLANT
NDL HYPO 25X5/8 SAFETYGLIDE (NEEDLE) ×1 IMPLANT
NEEDLE HYPO 22GX1.5 SAFETY (NEEDLE) ×2 IMPLANT
NEEDLE HYPO 25X5/8 SAFETYGLIDE (NEEDLE) ×2 IMPLANT
NS IRRIG 1000ML POUR BTL (IV SOLUTION) ×2 IMPLANT
PACK C SECTION WH (CUSTOM PROCEDURE TRAY) ×2 IMPLANT
PAD ABD 7.5X8 STRL (GAUZE/BANDAGES/DRESSINGS) ×2 IMPLANT
PAD OB MATERNITY 4.3X12.25 (PERSONAL CARE ITEMS) ×2 IMPLANT
RTRCTR C-SECT PINK 25CM LRG (MISCELLANEOUS) IMPLANT
SUT PDS AB 0 CTX 36 PDP370T (SUTURE) IMPLANT
SUT PLAIN 2 0 (SUTURE) ×2
SUT PLAIN 2 0 XLH (SUTURE) IMPLANT
SUT PLAIN ABS 2-0 CT1 27XMFL (SUTURE) IMPLANT
SUT VIC AB 0 CTX 36 (SUTURE) ×4
SUT VIC AB 0 CTX36XBRD ANBCTRL (SUTURE) ×2 IMPLANT
SUT VIC AB 4-0 KS 27 (SUTURE) ×2 IMPLANT
SYR CONTROL 10ML LL (SYRINGE) ×2 IMPLANT
TOWEL OR 17X24 6PK STRL BLUE (TOWEL DISPOSABLE) ×2 IMPLANT
TRAY FOLEY W/BAG SLVR 14FR LF (SET/KITS/TRAYS/PACK) ×2 IMPLANT
WATER STERILE IRR 1000ML POUR (IV SOLUTION) ×2 IMPLANT

## 2022-06-07 NOTE — H&P (Cosign Needed Addendum)
Sonya Tapia is a 31 y.o. female G2P1001 with IUP at [redacted]w[redacted]d by 2 [place]  presenting for IOL for GHTN. She has a history of NSVD due to [facility]. She would like to [facility]. SHe has history of DVT in 2020 due to COVID, is taking Lovenox 40 Pavo.  She reports positive fetal movement. She denies leakage of fluid or vaginal bleeding.  Prenatal History/Complications: [facility]NC at [facility] Pregnancy complications:  - Past Medical History: Past Medical History:  Diagnosis Date   DVT (deep venous thrombosis) (HCC)    Sickle cell anemia (HCC)     Past Surgical History: Past Surgical History:  Procedure Laterality Date   CESAREAN SECTION      Obstetrical History: OB History     Gravida  2   Para  1   Term  1   Preterm      AB      Living  1      SAB      IAB      Ectopic      Multiple      Live Births  1            Social History: Social History   Socioeconomic History   Marital status: Single    Spouse name: Not on file   Number of children: Not on file   Years of education: Not on file   Highest education level: Not on file  Occupational History   Not on file  Tobacco Use   Smoking status: Never   Smokeless tobacco: Never  Vaping Use   Vaping Use: Never used  Substance and Sexual Activity   Alcohol use: Not Currently    Comment: not while preg   Drug use: Never   Sexual activity: Yes    Birth control/protection: None  Other Topics Concern   Not on file  Social History Narrative   Not on file   Social Determinants of Health   Financial Resource Strain: Not on file  Food Insecurity: Food Insecurity Present (04/24/2022)   Hunger Vital Sign    Worried About Running Out of Food in the Last Year: Sometimes true    Ran Out of Food in the Last Year: Never true  Transportation Needs: No Transportation Needs (04/24/2022)   PRAPARE - Administrator, Civil Service (Medical): No    Lack of Transportation (Non-Medical): No  Physical Activity: Not on file   Stress: Not on file  Social Connections: Not on file    Family History: Family History  Problem Relation Age of Onset   Hypertension Mother    Healthy Father     Allergies: No Known Allergies  Medications Prior to Admission  Medication Sig Dispense Refill Last Dose   aspirin EC 81 MG tablet Take 1 tablet (81 mg total) by mouth daily. Take after 12 weeks for prevention of preeclampsia later in pregnancy 300 tablet 2    Blood Pressure Monitoring DEVI 1 each by Does not apply route once a week. 1 each 0    enoxaparin (LOVENOX) 40 MG/0.4ML injection Inject 0.4 mLs (40 mg total) into the skin daily. 12 mL 3    ergocalciferol (VITAMIN D2) 1.25 MG (50000 UT) capsule Take 50,000 Units by mouth once a week.      ferrous sulfate (FERROUSUL) 325 (65 FE) MG tablet Take 1 tablet (325 mg total) by mouth every other day. 60 tablet 1    FOLIC ACID PO Take by mouth.  Prenatal Vit-Fe Fumarate-FA (MULTIVITAMIN-PRENATAL) 27-0.8 MG TABS tablet Take 1 tablet by mouth daily at 12 noon.       Review of Systems   Constitutional: Negative for fever and chills Eyes: Negative for visual disturbances Respiratory: Negative for shortness of breath, dyspnea Cardiovascular: Negative for chest pain or palpitations  Gastrointestinal: Negative for vomiting, diarrhea and constipation.  POSITIVE for abdominal pain (contractions) Genitourinary: Negative for dysuria and urgency Musculoskeletal: Negative for back pain, joint pain, myalgias  Neurological: Negative for dizziness and headaches  Height 5\' 4"  (1.626 m), weight 90.7 kg, last menstrual period 09/17/2021. General appearance: alert, cooperative, and no distress Lungs: normal respiratory effort Heart: regular rate and rhythm Abdomen: soft, non-tender; bowel sounds normal Extremities: Homans sign is negative, no sign of DVT DTR's 2+ Presentation: cephalic Fetal monitoring   150 BPM, MOD VAR, PRESENT ACEL, NO DECELS, CTX Q 1-3 Uterine activity  Q2-3      Prenatal labs: ABO, Rh: A/Positive/-- (03/23 1646) Antibody: Negative (03/23 1646) Rubella: 5.71 (03/23 1646) RPR: Non Reactive (05/25 0931)  HBsAg: Negative (03/23 1646)  HIV: Non Reactive (05/25 0931)  GBS:   not resulted 1 hr Glucola Passed Genetic screening  passed  Anatomy 03-17-2003 normal  Prenatal Transfer Tool  Maternal Diabetes: No Genetic Screening: Abnormal:  Results: Other:Comanche disease, 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  Assessment: Sonya Tapia is a 31 y.o. G2P1001 with an IUP at [redacted]w[redacted]d presenting for IOL for GHTN. She desires to [facility].  She has sickle cell disease and is on lovenox for h/o of DVT.    She denies any history of GBS infection in prior pregnancy  Patient would like to start with pitocin; she declines FB for now.  Plan: #Labor: expectant management #Pain:  Per request #FWB Cat 1 #ID: GBS: unknown-patient previous GBS infection,  #MOF:  breast #MOC: pops #Circ: yes   [facility]TA [facility] 06/07/2022, 2:52 AM

## 2022-06-07 NOTE — Op Note (Signed)
PreOp Diagnosis:  1) Intrauterine pregnancy 2) Failed TOLAC 3) Gestational HTN 4) Non-reassuring fetal well being  PostOp Diagnosis: same and uterine rupture Procedure: Repeat C-section Surgeon: Dr. Myna Hidalgo Assistant: Dr. Jaynie Collins Anesthesia: epidural Complications: none EBL: 700cc UOP: 600cc Fluids: 1600cc  Findings: Uterine rupture noted along prior low transverse hysterotomy scar, normal fallopian tubes and ovaries, female infant floating in abdomen.  Delivered footling breech.  PROCEDURE:  Informed consent was obtained from the patient with risks, benefits, complications, treatment options, and expected outcomes discussed with the patient.  The patient concurred with the proposed plan, giving informed consent with form signed.   The patient was taken to Operating Room, and identified with the procedure verified as C-Section Delivery with Time Out. With induction of anesthesia, the patient was prepped and draped in the usual sterile fashion. A Pfannenstiel incision was made and carried down through the subcutaneous tissue to the fascia. The fascia was incised in the midline and extended transversely. The rectus muscle was separated in the midline.  The peritoneum was entered bluntly and immediately blood was noted.   Alexis retractor was placed.  Placenta was visualized.  The baby was palpated in the upper abdomen.  Fetal leg was identified and brought to the incision, followed by delivery  of the other leg.  A blue towel was placed over the sacrum and the trunk and arms delivered.  The head was flexed and delivered.  The umbilical cord was clamped, cut and baby was handed off to awaiting pediatricians.     The placenta was removed and will be sent to pathology. The uterine outline, tubes and ovaries appeared normal. The uterus appeared to have ruptured along the entire prior hysterotomy.  The uterus was closed with running locked sutures of 0 Vicryl and a second layer of the same  stitch was used in an interrupted fashion to obtain hemostasis and re-approximation.  Alexis retractor was removed.  The fascia was then reapproximated with running sutures of 0 Vicryl. The subcutaneous tissue was reapproximated with 2-0 plain gut suture.  The skin was closed with 4-0 vicryl in a subcuticular fashion.  Instrument, sponge, and needle counts were correct prior the abdominal closure and at the conclusion of the case. The patient was taken to recovery in stable condition.   An experienced assistant was required given the standard of surgical care given the complexity of the case.  This assistant was needed for exposure, dissection, suctioning, retraction, instrument exchange, assisting with delivery with administration of fundal pressure, and for overall help during the procedure.  Myna Hidalgo, DO Attending Obstetrician & Gynecologist, [facility] for [employer], [facility]

## 2022-06-07 NOTE — Progress Notes (Addendum)
Late entry:  Called to bedside due to difficulty obtaining FHT and concern for FHT in the 100s.  Pt was repositioned, EFM was attempted.  Called for bedside US.  US obtained with some difficulty, FHT appeared low.  EFM placed on location of visible chest/abdomen with FHT 100-105.  Stat C-section was called due to fetal bradycardia.  Verbal consent obtained, OR notified.  Myna Hidalgo, DO Attending Obstetrician & Gynecologist, [facility] for [employer], [facility]

## 2022-06-07 NOTE — Discharge Summary (Signed)
Postpartum Discharge Summary  Date of Service updated***     Patient Name: Sonya Tapia DOB: [birth year] MRN: [id number]  Date of admission: 06/07/2022 Delivery date:06/07/2022  Delivering provider: Janyth Pupa  Date of discharge: 06/07/2022  Admitting diagnosis: Indication for care or intervention in labor or delivery [O75.9] Intrauterine pregnancy: [redacted]w[redacted]d    Secondary diagnosis:  Principal Problem:   S/P cesarean section Active Problems:   Supervision of high risk pregnancy, antepartum   Sickle-HgbC anemia of mother during pregnancy ([facility]   Previous cesarean section   Alpha thalassemia silent carrier   History of DVT (deep vein thrombosis)   Gestational hypertension   Uterine rupture  Additional problems: ***    Discharge diagnosis: Term Pregnancy Delivered                                              Post partum procedures:{Postpartum procedures:23558} Augmentation: Pitocin and IP Foley Complications: Uterine Rupture  Hospital course: Induction of Labor With Cesarean Section   31y.o. yo G2P1001 at 335w1das admitted to the hospital 06/07/2022 for induction of labor. Patient was augmented with pitocin and FB placement. During her labor FHT abruptly unable to be traced. Attending called to bedside and ultrasound confirmed presence of fetus outside of the uterus and a code cesarean was called. The patient went for cesarean section due to Non-Reassuring FHR and uterine rupture . Delivery details are as follows: Membrane Rupture Time/Date:  ,   Delivery Method:C-Section, Low Transverse  Details of operation can be found in separate operative Note.  Patient had an uncomplicated postpartum course. She is ambulating, tolerating a regular diet, passing flatus, and urinating well.  Patient is discharged home in stable condition on 06/07/22.      Newborn Data: Birth date:06/07/2022  Birth time:8:10 PM  Gender:Female  Living status:  Apgars:1 ,5  Weight:3440 g                                 Magnesium Sulfate received: No BMZ received: No Rhophylac:N/A MMR:N/A T-DaP:Given prenatally Flu: No Transfusion:{Transfusion received:30440034}  Physical exam  Vitals:   06/07/22 1933 06/07/22 1940 06/07/22 1947 06/07/22 1950  BP: 132/84     Pulse: 75     Resp:      Temp:      TempSrc:      SpO2:  98% 99% 99%  Weight:      Height:       General: {Exam; general:21111117} Lochia: {Desc; appropriate/inappropriate:30686::"appropriate"} Uterine Fundus: {Desc; firm/soft:30687} Incision: {Exam; incision:21111123} DVT Evaluation: {Exam; dvt:2111122} Labs: Lab Results  Component Value Date   WBC 6.6 06/07/2022   HGB 10.1 (L) 06/07/2022   HCT 28.5 (L) 06/07/2022   MCV 81.4 06/07/2022   PLT 82 (L) 06/07/2022      Latest Ref Rng & Units 06/07/2022    3:30 AM  CMP  Glucose 70 - 99 mg/dL 70   BUN 6 - 20 mg/dL <5   Creatinine 0.44 - 1.00 mg/dL 0.93   Sodium 135 - 145 mmol/L 133   Potassium 3.5 - 5.1 mmol/L 3.7   Chloride 98 - 111 mmol/L 105   CO2 22 - 32 mmol/L 19   Calcium 8.9 - 10.3 mg/dL 8.8   Total Protein 6.5 - 8.1 g/dL 6.2   Total  Bilirubin 0.3 - 1.2 mg/dL 1.7   Alkaline Phos 38 - 126 U/L 124   AST 15 - 41 U/L 24   ALT 0 - 44 U/L 11    Edinburgh Score:     No data to display           After visit meds:  Allergies as of 06/07/2022   No Known Allergies   Med Rec must be completed prior to using this [facility]***        Discharge home in stable condition Infant Feeding: {Baby feeding:23562} Infant Disposition:{CHL IP OB HOME WITH ZFPOIP:18984} Discharge instruction: per After Visit Summary and Postpartum booklet. Activity: Advance as tolerated. Pelvic rest for 6 weeks.  Diet: {OB KJIZ:12811886} Future Appointments: Future Appointments  Date Time Provider [place]  06/11/2022  1:15 PM Radene Gunning, MD [facility]  06/11/2022  3:30 PM [facility] NURSE [facility]  06/11/2022  3:45 PM [facility] US4 [facility]  06/18/2022  3:15 PM  Caren Macadam, MD [facility]  06/19/2022 12:30 PM [facility] NURSE [facility]  06/19/2022 12:45 PM [facility] US5 [facility]  06/24/2022 10:55 AM Caren Macadam, MD [facility]  06/26/2022 12:30 PM [facility] NURSE [facility]  06/26/2022 12:45 PM [facility] US5 [facility]  07/01/2022 10:15 AM Danielle Rankin [facility]  07/02/2022 11:15 AM [facility] NST [facility] [place]   Follow up Visit:   Please schedule this patient for a In person postpartum visit in 6 weeks with the following provider: Any provider. Additional Postpartum F/U:Incision check 1 week and BP check 1 week  High risk pregnancy complicated by: HTN and hx of C/S Delivery mode:  C-Section, Low Transverse  Anticipated Birth Control:  POPs   06/07/2022 Naaman Plummer Autry-Lott, DO

## 2022-06-07 NOTE — Anesthesia Preprocedure Evaluation (Signed)
Anesthesia Evaluation  Patient identified by MRN, date of birth, ID band Patient awake    Reviewed: Allergy & Precautions, NPO status , Patient's Chart, lab work & pertinent test results  Airway Mallampati: II  TM Distance: >3 FB Neck ROM: Full    Dental no notable dental hx.    Pulmonary neg pulmonary ROS,    Pulmonary exam normal breath sounds clear to auscultation       Cardiovascular hypertension (gHTN), + DVT (on lovenox, last dose 7/21 7am)  Normal cardiovascular exam Rhythm:Regular Rate:Normal     Neuro/Psych negative neurological ROS  negative psych ROS   GI/Hepatic negative GI ROS, Neg liver ROS,   Endo/Other  negative endocrine ROS  Renal/GU negative Renal ROS  negative genitourinary   Musculoskeletal negative musculoskeletal ROS (+)   Abdominal   Peds  Hematology  (+) Blood dyscrasia (gestational thrombocytopenia ), Sickle cell anemia and anemia ,   Anesthesia Other Findings   Reproductive/Obstetrics (+) Pregnancy (TOLAC)                             Anesthesia Physical Anesthesia Plan  ASA: 3  Anesthesia Plan: Epidural   Post-op Pain Management:    Induction:   PONV Risk Score and Plan: Treatment may vary due to age or medical condition  Airway Management Planned: Natural Airway  Additional Equipment:   Intra-op Plan:   Post-operative Plan:   Informed Consent: I have reviewed the patients History and Physical, chart, labs and discussed the procedure including the risks, benefits and alternatives for the proposed anesthesia with the patient or authorized representative who has indicated his/her understanding and acceptance.       Plan Discussed with: Anesthesiologist  Anesthesia Plan Comments: (Patient identified. Risks, benefits, options discussed with patient including but not limited to bleeding, infection, nerve damage, paralysis, failed block, incomplete  pain control, headache, blood pressure changes, nausea, vomiting, reactions to medication, itching, and post partum back pain. Confirmed with bedside nurse the patient's most recent platelet count. Confirmed with the patient that they are not taking any anticoagulation, have any bleeding history or any family history of bleeding disorders. Patient expressed understanding and wishes to proceed. All questions were answered. )        Anesthesia Quick Evaluation

## 2022-06-07 NOTE — Progress Notes (Addendum)
Labor Progress Note Sonya Tapia is a 32 y.o. G2P1001 at [redacted]w[redacted]d presented for IOL 2/2 gHTN, TOLAC.  S: Feeling period cramping but otherwise resting.    O:  BP 123/78   Pulse 75   Temp 98 F (36.7 C) (Oral)   Resp 16   Ht 5\' 4"  (1.626 m)   Wt 90.7 kg   LMP 09/17/2021   BMI 34.33 kg/m  EFM: 135bpm/moderate/ +accels, no decels  contractions every 2-3 mins  CVE: Dilation: 1.5 Effacement (%): 10, 20 Station: -3 Presentation: Vertex Exam by:: [email or web address], RN   A&P: 31 y.o. G2P1001 [redacted]w[redacted]d presented for IOL 2/2 gHTN, TOLAC.  #Labor: Unchanged. Will place FB to encourage cervical dilatation. Continue pitocin at 12 at this time. Consider AROM when FB out.  #Pain: Maternally supported, planning epidural #FWB: Cat I #GBS  unknown  gHTN- BPs are appropriate -Continue to monitor  [redacted]w[redacted]d, DO 12:09 PM

## 2022-06-07 NOTE — Transfer of Care (Signed)
Immediate Anesthesia Transfer of Care Note  Patient: Sonya Tapia  Procedure(s) Performed: CESAREAN SECTION (Abdomen)  Patient Location: PACU  Anesthesia Type:Epidural  Level of Consciousness: awake, alert  and oriented  Airway & Oxygen Therapy: Patient Spontanous Breathing  Post-op Assessment: Report given to RN and Post -op Vital signs reviewed and stable  Post vital signs: Reviewed and stable  Last Vitals:  Vitals Value Taken Time  BP 94/49 06/07/22 2145  Temp    Pulse 95 06/07/22 2148  Resp 17 06/07/22 2148  SpO2 100 % 06/07/22 2148  Vitals shown include unvalidated device data.  Last Pain:  Vitals:   06/07/22 1759  TempSrc:   PainSc: 9       Patients Stated Pain Goal: 0 (06/07/22 1759)  Complications: No notable events documented.

## 2022-06-07 NOTE — Progress Notes (Signed)
BP rechecked immediately with hands  well placed before initiating treatment.

## 2022-06-07 NOTE — Progress Notes (Signed)
Labor Progress Note Sonya Tapia is a 31 y.o. G2P1001 at [redacted]w[redacted]d presented for IOL 2/2 gHTN, TOLAC.  S: Having discomfort with more frequent contractions. Would like to get her epidural at this time.   O:  BP 129/80   Pulse 93   Temp 98 F (36.7 C) (Oral)   Resp 16   Ht 5\' 4"  (1.626 m)   Wt 90.7 kg   LMP 09/17/2021   BMI 34.33 kg/m  EFM: 140 bpm/moderate /+ acels, no decels Toco: irritability  CVE: Dilation: 1 Effacement (%): Thick Station: -3 Presentation: Vertex Exam by:: [email or web address], RN   A&P: 31 y.o. G2P1001 [redacted]w[redacted]d presented for IOL 2/2 gHTN, TOLAC.  #Labor: Continue pitocin at 12 at this time. Consider AROM when FB out.  #Pain: Planning for epidural anesthesia at bedside #FWB: Cat I #GBS  unknown   gHTN- BPs are appropriate -Continue to monitor  [redacted]w[redacted]d, DO 6:06 PM

## 2022-06-08 DIAGNOSIS — S3769XA Other injury of uterus, initial encounter: Secondary | ICD-10-CM

## 2022-06-08 LAB — CBC
HCT: 24.5 % — ABNORMAL LOW (ref 36.0–46.0)
HCT: 26.3 % — ABNORMAL LOW (ref 36.0–46.0)
Hemoglobin: 8.8 g/dL — ABNORMAL LOW (ref 12.0–15.0)
Hemoglobin: 9.4 g/dL — ABNORMAL LOW (ref 12.0–15.0)
MCH: 29.1 pg (ref 26.0–34.0)
MCH: 29 pg (ref 26.0–34.0)
MCHC: 35.9 g/dL (ref 30.0–36.0)
MCHC: 35.7 g/dL (ref 30.0–36.0)
MCV: 81.1 fL (ref 80.0–100.0)
MCV: 81.2 fL (ref 80.0–100.0)
Platelets: 78 10*3/uL — ABNORMAL LOW (ref 150–400)
Platelets: 90 10*3/uL — ABNORMAL LOW (ref 150–400)
RBC: 3.02 MIL/uL — ABNORMAL LOW (ref 3.87–5.11)
RBC: 3.24 MIL/uL — ABNORMAL LOW (ref 3.87–5.11)
RDW: 14.5 % (ref 11.5–15.5)
RDW: 14.5 % (ref 11.5–15.5)
WBC: 9.8 10*3/uL (ref 4.0–10.5)
WBC: 11.6 10*3/uL — ABNORMAL HIGH (ref 4.0–10.5)
nRBC: 0 % (ref 0.0–0.2)
nRBC: 0 % (ref 0.0–0.2)

## 2022-06-08 LAB — CULTURE, BETA STREP (GROUP B ONLY): Strep Gp B Culture: NEGATIVE

## 2022-06-08 MED ORDER — ACETAMINOPHEN 500 MG PO TABS: 1000.0000 mg | ORAL_TABLET | Freq: Four times a day (QID) | ORAL | Status: DC

## 2022-06-08 MED ORDER — SODIUM CHLORIDE 0.9% FLUSH
3.0000 mL | INTRAVENOUS | Status: DC | PRN
Start: 2022-06-08 — End: 2022-06-10

## 2022-06-08 MED ORDER — METHYLPREDNISOLONE SODIUM SUCC 125 MG IJ SOLR: 125.0000 mg | Freq: Once | INTRAMUSCULAR | Status: DC | PRN

## 2022-06-08 MED ORDER — DIPHENHYDRAMINE HCL 50 MG/ML IJ SOLN: 12.5000 mg | INTRAMUSCULAR | Status: DC | PRN

## 2022-06-08 MED ORDER — KETOROLAC TROMETHAMINE 30 MG/ML IJ SOLN
30.0000 mg | Freq: Four times a day (QID) | INTRAMUSCULAR | Status: DC | PRN
Start: 2022-06-08 — End: 2022-06-08

## 2022-06-08 MED ORDER — ALBUTEROL SULFATE (2.5 MG/3ML) 0.083% IN NEBU: 2.5000 mg | INHALATION_SOLUTION | Freq: Once | RESPIRATORY_TRACT | Status: DC | PRN

## 2022-06-08 MED ORDER — ONDANSETRON HCL 4 MG/2ML IJ SOLN
4.0000 mg | Freq: Three times a day (TID) | INTRAMUSCULAR | Status: DC | PRN
  Administered 2022-06-08: 4 mg via INTRAVENOUS
  Filled 2022-06-08: qty 2

## 2022-06-08 MED ORDER — SCOPOLAMINE 1 MG/3DAYS TD PT72
1.0000 | MEDICATED_PATCH | Freq: Once | TRANSDERMAL | Status: DC
  Administered 2022-06-08: 1.5 mg via TRANSDERMAL
  Filled 2022-06-08: qty 1

## 2022-06-08 MED ORDER — CHLOROPROCAINE HCL (PF) 3 % IJ SOLN
INTRAMUSCULAR | Status: DC | PRN
  Administered 2022-06-07: 30 mL via EPIDURAL

## 2022-06-08 MED ORDER — NALOXONE HCL 4 MG/10ML IJ SOLN: 1.0000 ug/kg/h | INTRAVENOUS | Status: DC | PRN

## 2022-06-08 MED ORDER — KETOROLAC TROMETHAMINE 30 MG/ML IJ SOLN: 30.0000 mg | Freq: Four times a day (QID) | INTRAMUSCULAR | Status: DC | PRN

## 2022-06-08 MED ORDER — IRON SUCROSE 20 MG/ML IV SOLN
500.0000 mg | Freq: Once | INTRAVENOUS | Status: AC
  Administered 2022-06-08: 500 mg via INTRAVENOUS
  Filled 2022-06-08: qty 500

## 2022-06-08 MED ORDER — SODIUM CHLORIDE 0.9 % IV SOLN
INTRAVENOUS | Status: DC | PRN
  Administered 2022-06-08: 10 mL/h via INTRAVENOUS

## 2022-06-08 MED ORDER — DIPHENHYDRAMINE HCL 25 MG PO CAPS: 25.0000 mg | ORAL_CAPSULE | ORAL | Status: DC | PRN

## 2022-06-08 MED ORDER — ENOXAPARIN SODIUM 40 MG/0.4ML IJ SOSY
40.0000 mg | PREFILLED_SYRINGE | INTRAMUSCULAR | Status: DC
  Administered 2022-06-08 – 2022-06-09 (×2): 40 mg via SUBCUTANEOUS
  Filled 2022-06-08 (×2): qty 0.4

## 2022-06-08 MED ORDER — NALOXONE HCL 0.4 MG/ML IJ SOLN: 0.4000 mg | INTRAMUSCULAR | Status: DC | PRN

## 2022-06-08 MED ORDER — SODIUM CHLORIDE 0.9 % IV BOLUS: 500.0000 mL | Freq: Once | INTRAVENOUS | Status: DC | PRN

## 2022-06-08 MED ORDER — IBUPROFEN 600 MG PO TABS: 600.0000 mg | ORAL_TABLET | Freq: Four times a day (QID) | ORAL | Status: DC

## 2022-06-08 MED ORDER — EPINEPHRINE PF 1 MG/ML IJ SOLN: 0.3000 mg | Freq: Once | INTRAMUSCULAR | Status: DC | PRN

## 2022-06-08 NOTE — Progress Notes (Signed)
POSTPARTUM PROGRESS NOTE  POD #1  Subjective:  Sonya Tapia is a 31 y.o. [id number] s/p repeat LTCS at [redacted]w[redacted]d. Today she notes no acute complaints. Foley in place, she has not yet voided or ambulated.  Tolerating sips/chips.  Denies nausea or vomiting. She has no flatus, no BM.  Pain is well controlled.  Lochia minimal. Denies fever/chills/chest pain/SOB.  Objective: Blood pressure 135/89, pulse (!) 55, temperature 97.7 F (36.5 C), temperature source Oral, resp. rate 16, height 5\' 4"  (1.626 m), weight 90.7 kg, last menstrual period 09/17/2021, SpO2 98 %, unknown if currently breastfeeding.  Physical Exam:  General: alert, cooperative and no distress Chest: no respiratory distress Heart: regular rate and rhythm Abdomen: soft, nontender, +BS Uterine Fundus: firm, appropriately tender Incision: C/D/I with honeycomb DVT Evaluation: No calf swelling or tenderness Extremities: no edema Skin: warm, dry  Results for orders placed or performed during the hospital encounter of 06/07/22 (from the past 24 hour(s))  Protein / creatinine ratio, urine     Status: None   Collection Time: 06/07/22  9:40 AM  Result Value Ref Range   Creatinine, Urine 115 mg/dL   Total Protein, Urine 14 mg/dL   Protein Creatinine Ratio 0.12 0.00 - 0.15 mg/mg[Cre]  CBC     Status: Abnormal   Collection Time: 06/07/22  9:40 AM  Result Value Ref Range   WBC 6.3 4.0 - 10.5 K/uL   RBC 3.59 (L) 3.87 - 5.11 MIL/uL   Hemoglobin 10.2 (L) 12.0 - 15.0 g/dL   HCT 06/09/22 (L) 44.0 - 10.2 %   MCV 81.6 80.0 - 100.0 fL   MCH 28.4 26.0 - 34.0 pg   MCHC 34.8 30.0 - 36.0 g/dL   RDW 72.5 36.6 - 44.0 %   Platelets 87 (L) 150 - 400 K/uL   nRBC 0.3 (H) 0.0 - 0.2 %  CBC     Status: Abnormal   Collection Time: 06/07/22  3:36 PM  Result Value Ref Range   WBC 6.6 4.0 - 10.5 K/uL   RBC 3.50 (L) 3.87 - 5.11 MIL/uL   Hemoglobin 10.1 (L) 12.0 - 15.0 g/dL   HCT 06/09/22 (L) 42.5 - 95.6 %   MCV 81.4 80.0 - 100.0 fL   MCH 28.9 26.0 - 34.0 pg    MCHC 35.4 30.0 - 36.0 g/dL   RDW 38.7 56.4 - 33.2 %   Platelets 82 (L) 150 - 400 K/uL   nRBC 0.0 0.0 - 0.2 %  CBC     Status: Abnormal   Collection Time: 06/07/22 10:22 PM  Result Value Ref Range   WBC 8.2 4.0 - 10.5 K/uL   RBC 3.24 (L) 3.87 - 5.11 MIL/uL   Hemoglobin 9.3 (L) 12.0 - 15.0 g/dL   HCT 06/09/22 (L) 88.4 - 16.6 %   MCV 81.2 80.0 - 100.0 fL   MCH 28.7 26.0 - 34.0 pg   MCHC 35.4 30.0 - 36.0 g/dL   RDW 06.3 01.6 - 01.0 %   Platelets 96 (L) 150 - 400 K/uL   nRBC 0.2 0.0 - 0.2 %  CBC     Status: Abnormal   Collection Time: 06/08/22  5:55 AM  Result Value Ref Range   WBC 9.8 4.0 - 10.5 K/uL   RBC 3.02 (L) 3.87 - 5.11 MIL/uL   Hemoglobin 8.8 (L) 12.0 - 15.0 g/dL   HCT 06/10/22 (L) 35.5 - 73.2 %   MCV 81.1 80.0 - 100.0 fL   MCH 29.1 26.0 - 34.0 pg  MCHC 35.9 30.0 - 36.0 g/dL   RDW 62.9 47.6 - 54.6 %   Platelets 78 (L) 150 - 400 K/uL   nRBC 0.0 0.0 - 0.2 %    Assessment/Plan: Sonya Tapia is a 31 y.o. G2P2002 s/p rLTCS at [redacted]w[redacted]d POD#1 complicated by: 1) Heme- Thrombocytopenia, Anemia CBC as above, pt agreeable to IV venofer today On Lovenox for DVT prophylaxis will hold for now Repeat CBC today at noon  2) Postop -pain well controlled, plan to avoid NSAIDs if possible -UOP appropriate, plan to discontinue foley later today -encourage ambulation -SCDs for prophylaxis and if platelets improved will restart Lovenox  Contraception: POPs Feeding: in NICU  Dispo: Continue routine postop care   LOS: 1 day   Myna Hidalgo, DO Faculty Attending, Center for [facility] 06/08/2022, 7:30 AM

## 2022-06-08 NOTE — Lactation Note (Signed)
This note was copied from a baby's chart. Lactation Consultation Note  Patient Name: Sonya Tapia Date: 06/08/2022   Age:29 hours  Attempted to visit with mom but OB Specialty care RN Olegario Messier voiced that MOB needed to go to the bathroom, LC to come back this afternoon for initial assessment.    [name] S Cortavious Nix 06/08/2022, 11:48 AM

## 2022-06-08 NOTE — Anesthesia Procedure Notes (Addendum)
Epidural Patient location during procedure: OB Start time: 06/07/2022 6:10 PM End time: 06/07/2022 6:20 PM  Staffing Anesthesiologist: Elmer Picker, MD Performed: anesthesiologist   Preanesthetic Checklist Completed: patient identified, IV checked, risks and benefits discussed, monitors and equipment checked, pre-op evaluation and timeout performed  Epidural Patient position: sitting Prep: DuraPrep and site prepped and draped Patient monitoring: continuous pulse ox, blood pressure, heart rate and cardiac monitor Approach: midline Location: L3-L4 Injection technique: LOR air  Needle:  Needle type: Tuohy  Needle gauge: 17 G Needle length: 9 cm Needle insertion depth: 5 cm Catheter type: closed end flexible Catheter size: 19 Gauge Catheter at skin depth: 10 cm Test dose: negative  Assessment Sensory level: T8 Events: blood not aspirated, injection not painful, no injection resistance, no paresthesia and negative IV test  Additional Notes Patient identified. Risks/Benefits/Options discussed with patient including but not limited to bleeding, infection, nerve damage, paralysis, failed block, incomplete pain control, headache, blood pressure changes, nausea, vomiting, reactions to medication both or allergic, itching and postpartum back pain. Confirmed with bedside nurse the patient's most recent platelet count. Confirmed with patient that they are not currently taking any anticoagulation, have any bleeding history or any family history of bleeding disorders. Patient expressed understanding and wished to proceed. All questions were answered. Sterile technique was used throughout the entire procedure. Please see nursing notes for vital signs. Test dose was given through epidural catheter and negative prior to continuing to dose epidural or start infusion. Warning signs of high block given to the patient including shortness of breath, tingling/numbness in hands, complete motor block,  or any concerning symptoms with instructions to call for help. Patient was given instructions on fall risk and not to get out of bed. All questions and concerns addressed with instructions to call with any issues or inadequate analgesia.  Reason for block:procedure for pain

## 2022-06-08 NOTE — Plan of Care (Signed)
  Problem: Health Behavior/Discharge Planning: Goal: Ability to manage health-related needs will improve Outcome: Progressing   Problem: Clinical Measurements: Goal: Ability to maintain clinical measurements within normal limits will improve Outcome: Progressing Goal: Will remain free from infection Outcome: Progressing Goal: Diagnostic test results will improve Outcome: Progressing Goal: Respiratory complications will improve Outcome: Progressing Goal: Cardiovascular complication will be avoided Outcome: Progressing   Problem: Activity: Goal: Risk for activity intolerance will decrease Outcome: Progressing   Problem: Nutrition: Goal: Adequate nutrition will be maintained Outcome: Progressing   Problem: Coping: Goal: Level of anxiety will decrease Outcome: Progressing   Problem: Elimination: Goal: Will not experience complications related to bowel motility Outcome: Progressing Goal: Will not experience complications related to urinary retention Outcome: Progressing   Problem: Pain Managment: Goal: General experience of comfort will improve Outcome: Progressing   Problem: Safety: Goal: Ability to remain free from injury will improve Outcome: Progressing   Problem: Skin Integrity: Goal: Risk for impaired skin integrity will decrease Outcome: Progressing   Problem: Education: Goal: Knowledge of disease or condition will improve Outcome: Progressing Goal: Knowledge of the prescribed therapeutic regimen will improve Outcome: Progressing   Problem: Fluid Volume: Goal: Peripheral tissue perfusion will improve Outcome: Progressing   Problem: Clinical Measurements: Goal: Complications related to disease process, condition or treatment will be avoided or minimized Outcome: Progressing   Problem: Education: Goal: Knowledge of condition will improve Outcome: Progressing Goal: Individualized Educational Video(s) Outcome: Progressing Goal: Individualized Newborn  Educational Video(s) Outcome: Progressing   Problem: Activity: Goal: Will verbalize the importance of balancing activity with adequate rest periods Outcome: Progressing Goal: Ability to tolerate increased activity will improve Outcome: Progressing   Problem: Coping: Goal: Ability to identify and utilize available resources and services will improve Outcome: Progressing   Problem: Life Cycle: Goal: Chance of risk for complications during the postpartum period will decrease Outcome: Progressing   Problem: Role Relationship: Goal: Ability to demonstrate positive interaction with newborn will improve Outcome: Progressing   Problem: Skin Integrity: Goal: Demonstration of wound healing without infection will improve Outcome: Progressing

## 2022-06-08 NOTE — Anesthesia Postprocedure Evaluation (Signed)
Anesthesia Post Note  Patient: Sonya Tapia  Procedure(s) Performed: CESAREAN SECTION (Abdomen)     Patient location during evaluation: PACU Anesthesia Type: Epidural Level of consciousness: oriented and awake and alert Pain management: pain level controlled Vital Signs Assessment: post-procedure vital signs reviewed and stable Respiratory status: spontaneous breathing, respiratory function stable and patient connected to nasal cannula oxygen Cardiovascular status: blood pressure returned to baseline and stable Postop Assessment: no headache, no backache and no apparent nausea or vomiting Anesthetic complications: no   No notable events documented.  Last Vitals:  Vitals:   06/07/22 2315 06/08/22 0345  BP: 100/86 135/81  Pulse: 90 71  Resp: 16 16  Temp: 37.1 C 36.4 C  SpO2: 99% 99%    Last Pain:  Vitals:   06/08/22 0345  TempSrc: Oral  PainSc:    Pain Goal: Patients Stated Pain Goal: 0 (06/07/22 1759)                 Keiva Dina L Jakerria Kingbird

## 2022-06-08 NOTE — Addendum Note (Signed)
Addendum  created 06/08/22 1317 by Lannie Fields, DO   [employer] accepted

## 2022-06-08 NOTE — Lactation Note (Signed)
This note was copied from a baby'Tapia chart.  NICU Lactation Consultation Note  Patient Name: [name] [id number] Date: 06/08/2022 Age:31 years  Subjective Reason for consult: Initial assessment; 1st time breastfeeding; Exclusive pumping and bottle feeding; NICU baby; Early term 16-38.6wks  Visited with mom of 21 hours old ETI NICU female, she'Tapia a P2 but this is her first time breastfeeding. She plans on exclusively pumping and bottle feeding but also voiced that if her supply is not enough she'll switch to formula. Reviewed pumping schedule, lactogenesis II and anticipatory guidelines.  Objective Infant data: Mother'Tapia Current Feeding Choice: Breast Milk and Formula  Maternal data: G2P2002  C-Section, Low Transverse Significant Breast History:: (++) breast changes, she started leaking colostrum at 20 weeks Current breast feeding challenges:: NICU admission Does the patient have breastfeeding experience prior to this delivery?: No Pumping frequency: q 3 hours (recommended) Flange Size: 24 Risk factor for low milk supply:: infant separation, blood loss of 726 ml Pump: Personal, DEBP (Motif DEBP at home)  Assessment Infant: Feeding Status: NPO  Intervention/Plan Interventions: Breast feeding basics reviewed; DEBP; Education Tools: Pump; Flanges Pump Education: Setup, frequency, and cleaning; Milk Storage  Plan of care: Encouraged mom to pump every 3 hours, ideally 8 pumping sessions/24 hours Breast massage and hand expression were also encouraged prior pumping  FOB present. All questions and concerns answered, family to contact [facility] services PRN.  Consult Status: NICU follow-up NICU Follow-up type: New admission follow up; Maternal D/C visit; Verify onset of copious milk; Verify absence of engorgement   Sonya Tapia Sonya Tapia 06/08/2022, 5:20 PM

## 2022-06-08 NOTE — Anesthesia Postprocedure Evaluation (Signed)
Anesthesia Post Note  Patient: Sonya Tapia  Procedure(s) Performed: CESAREAN SECTION (Abdomen)     Patient location during evaluation: Mother Baby Anesthesia Type: Epidural Level of consciousness: awake and alert Pain management: pain level controlled Vital Signs Assessment: post-procedure vital signs reviewed and stable Respiratory status: spontaneous breathing, nonlabored ventilation and respiratory function stable Cardiovascular status: stable Postop Assessment: no headache, no backache and epidural receding Anesthetic complications: no   No notable events documented.  Last Vitals:  Vitals:   06/08/22 0345 06/08/22 0726  BP: 135/81 135/89  Pulse: 71 (!) 55  Resp: 16 16  Temp: 36.4 C 36.5 C  SpO2: 99% 98%    Last Pain:  Vitals:   06/08/22 0726  TempSrc: Oral  PainSc:    Pain Goal: Patients Stated Pain Goal: 0 (06/07/22 1759)                 Fanny Dance

## 2022-06-08 NOTE — Progress Notes (Signed)
Most recent CBC plt 90; epidural catheter removed by myself at 13:00 without issue. Communicated to RN that lovenox can be restarted 4h after catheter removal (17:00).

## 2022-06-09 MED ORDER — IBUPROFEN 600 MG PO TABS
600.0000 mg | ORAL_TABLET | Freq: Four times a day (QID) | ORAL | Status: DC | PRN
  Administered 2022-06-09: 600 mg via ORAL
  Filled 2022-06-09: qty 1

## 2022-06-09 NOTE — Lactation Note (Signed)
This note was copied from a baby's chart.  NICU Lactation Consultation Note  Patient Name: Sonya Tapia [id number] Date: 06/09/2022 Age:29 hours  Subjective Reason for consult: Follow-up assessment; 1st time breastfeeding; Exclusive pumping and bottle feeding; NICU baby; Early term 50-38.6wks  Visited with mom of 50 hours old ETI NICU female, she's a P2 but this is her first time breastfeeding. Noticed that Ms. Brunet hasn't been pumping consistently, explained the importance of consistent pumping for the onset of lactogenesis II and to protect her supply. She denies any pain/discomfort at the breast at this point during pumping and at rest.  Objective Infant data: Mother's Current Feeding Choice: Breast Milk and Formula  Maternal data: G2P2002  C-Section, Low Transverse Significant Breast History:: (++) breast changes, she started leaking colostrum at 20 weeks Current breast feeding challenges:: NICU admission Does the patient have breastfeeding experience prior to this delivery?: No Pumping frequency: 2 times/24 hours Pumped volume: 0 mL (drops) Flange Size: 24 Risk factor for low milk supply:: infant separation, blood loss of 726 ml Pump: Personal, DEBP (Motif DEBP at home)  Assessment Infant: Feeding Status: NPO  Maternal: Milk volume: Normal  Intervention/Plan Interventions: Breast feeding basics reviewed; DEBP; Education; [employer] brochure Tools: Pump; Flanges Pump Education: Setup, frequency, and cleaning; Milk Storage  Plan of care: Encouraged mom to try pumping every 3 hours, ideally 8 pumping sessions/24 hours or the closest she can get to it Breast massage and hand expression were also encouraged prior pumping FOB will get coconut oil to start using prior pumping   FOB present but asleep. All questions and concerns answered, family to contact [facility] services PRN.  Consult Status: NICU follow-up NICU Follow-up type: Maternal D/C visit; Verify onset of  copious milk; Verify absence of engorgement   Sonya Tapia 06/09/2022, 11:25 AM

## 2022-06-09 NOTE — Progress Notes (Signed)
POSTPARTUM PROGRESS NOTE  POD #2  Subjective:  Sonya Tapia is a 31 y.o. G2P2002 s/p rLTCS at [redacted]w[redacted]d. Today she notes no acute complaints. She denies any problems with ambulating, voiding or po intake. Denies nausea or vomiting. She has passed flatus, no BM.  Pain is well controlled.  Lochia minimal Denies fever/chills/chest pain/SOB.  no HA, no blurry vision, noRUQ pain  Objective: Blood pressure (!) 106/54, pulse 95, temperature 98.5 F (36.9 C), temperature source Oral, resp. rate 18, height 5\' 4"  (1.626 m), weight 90.7 kg, last menstrual period 09/17/2021, SpO2 98 %, unknown if currently breastfeeding.  Physical Exam:  General: alert, cooperative and no distress Chest: no respiratory distress Heart: regular rate and rhythm Abdomen: soft, nontender, +BS Uterine Fundus: firm, appropriately tender Incision: C/D/I with honeycomb DVT Evaluation: No calf swelling or tenderness Extremities: no edema Skin: warm, dry  Results for orders placed or performed during the hospital encounter of 06/07/22 (from the past 24 hour(s))  CBC     Status: Abnormal   Collection Time: 06/08/22 12:11 PM  Result Value Ref Range   WBC 11.6 (H) 4.0 - 10.5 K/uL   RBC 3.24 (L) 3.87 - 5.11 MIL/uL   Hemoglobin 9.4 (L) 12.0 - 15.0 g/dL   HCT 06/10/22 (L) 97.9 - 89.2 %   MCV 81.2 80.0 - 100.0 fL   MCH 29.0 26.0 - 34.0 pg   MCHC 35.7 30.0 - 36.0 g/dL   RDW 11.9 41.7 - 40.8 %   Platelets 90 (L) 150 - 400 K/uL   nRBC 0.0 0.0 - 0.2 %    Assessment/Plan: Sonya Tapia is a 31 y.o. 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   LOS: 2 days   2021, DO Faculty Attending, Center for [facility] 06/09/2022, 6:38 AM

## 2022-06-10 ENCOUNTER — Encounter (HOSPITAL_COMMUNITY): Payer: Self-pay | Admitting: Obstetrics & Gynecology

## 2022-06-10 ENCOUNTER — Other Ambulatory Visit (HOSPITAL_COMMUNITY): Payer: Self-pay

## 2022-06-10 LAB — CBC
HCT: 19.6 % — ABNORMAL LOW (ref 36.0–46.0)
Hemoglobin: 7.1 g/dL — ABNORMAL LOW (ref 12.0–15.0)
MCH: 29.6 pg (ref 26.0–34.0)
MCHC: 36.2 g/dL — ABNORMAL HIGH (ref 30.0–36.0)
MCV: 81.7 fL (ref 80.0–100.0)
Platelets: 81 10*3/uL — ABNORMAL LOW (ref 150–400)
RBC: 2.4 MIL/uL — ABNORMAL LOW (ref 3.87–5.11)
RDW: 14.1 % (ref 11.5–15.5)
WBC: 7.6 10*3/uL (ref 4.0–10.5)
nRBC: 0.4 % — ABNORMAL HIGH (ref 0.0–0.2)

## 2022-06-10 LAB — PREPARE RBC (CROSSMATCH)

## 2022-06-10 LAB — CYTOLOGY - PAP: Diagnosis: NEGATIVE

## 2022-06-10 LAB — SURGICAL PATHOLOGY

## 2022-06-10 LAB — ABO/RH: ABO/RH(D): A POS

## 2022-06-10 MED ORDER — SODIUM CHLORIDE 0.9% IV SOLUTION: Freq: Once | INTRAVENOUS | Status: AC

## 2022-06-10 MED ORDER — SENNOSIDES-DOCUSATE SODIUM 8.6-50 MG PO TABS
2.0000 | ORAL_TABLET | Freq: Every evening | ORAL | 0 refills | Status: DC | PRN
  Filled 2022-06-10: qty 10, 5d supply, fill #0

## 2022-06-10 MED ORDER — SIMETHICONE 80 MG PO CHEW
80.0000 mg | CHEWABLE_TABLET | Freq: Four times a day (QID) | ORAL | 0 refills | Status: DC | PRN
  Filled 2022-06-10: qty 30, 8d supply, fill #0

## 2022-06-10 MED ORDER — ENOXAPARIN SODIUM 40 MG/0.4ML IJ SOSY
40.0000 mg | PREFILLED_SYRINGE | INTRAMUSCULAR | 0 refills | Status: DC
  Filled 2022-06-10: qty 12, 30d supply, fill #0

## 2022-06-10 MED ORDER — OXYCODONE-ACETAMINOPHEN 5-325 MG PO TABS
1.0000 | ORAL_TABLET | Freq: Four times a day (QID) | ORAL | 0 refills | Status: DC | PRN
  Filled 2022-06-10: qty 20, 5d supply, fill #0

## 2022-06-10 NOTE — Lactation Note (Signed)
This note was copied from a baby's chart.  NICU Lactation Consultation Note  Patient Name: Boy Aliyah Abeyta [id number] Date: 06/10/2022 Age:29 hours   Subjective Reason for consult: Follow-up assessment; Maternal discharge Mother to d/c today. She is pumping about 3xday and has not yet experienced + changes. We reviewed pumping best practices.  Objective Infant data: Mother's Current Feeding Choice: Breast Milk and Formula   Maternal data: G2P2002  C-Section, Low Transverse  Pumping frequency: 3xday Pumped volume: 5 mL Flange Size: 24  Risk factor for low milk supply:: infrequent pumping   Pump: Personal, DEBP (Motif DEBP at home)  Assessment Infant: Feeding Status: NPO   Maternal: Milk volume: Normal   Intervention/Plan Interventions: Education  Tools: Pump; Flanges Pump Education: Setup, frequency, and cleaning; Milk Storage  Plan: Consult Status: NICU follow-up  NICU Follow-up type: Verify onset of copious milk; Verify absence of engorgement  Increase pumping to q3h  Elder Negus 06/10/2022, 5:08 PM

## 2022-06-10 NOTE — Plan of Care (Signed)
  Problem: Health Behavior/Discharge Planning: Goal: Ability to manage health-related needs will improve Outcome: Adequate for Discharge   Problem: Clinical Measurements: Goal: Ability to maintain clinical measurements within normal limits will improve Outcome: Adequate for Discharge Goal: Will remain free from infection Outcome: Adequate for Discharge Goal: Diagnostic test results will improve Outcome: Adequate for Discharge   Problem: Activity: Goal: Risk for activity intolerance will decrease Outcome: Adequate for Discharge   Problem: Nutrition: Goal: Adequate nutrition will be maintained Outcome: Adequate for Discharge   Problem: Coping: Goal: Level of anxiety will decrease Outcome: Adequate for Discharge   Problem: Elimination: Goal: Will not experience complications related to bowel motility Outcome: Adequate for Discharge   Problem: Pain Managment: Goal: General experience of comfort will improve Outcome: Adequate for Discharge   Problem: Safety: Goal: Ability to remain free from injury will improve Outcome: Adequate for Discharge   Problem: Skin Integrity: Goal: Risk for impaired skin integrity will decrease Outcome: Adequate for Discharge   Problem: Education: Goal: Knowledge of disease or condition will improve Outcome: Adequate for Discharge Goal: Knowledge of the prescribed therapeutic regimen will improve Outcome: Adequate for Discharge   Problem: Fluid Volume: Goal: Peripheral tissue perfusion will improve Outcome: Adequate for Discharge   Problem: Clinical Measurements: Goal: Complications related to disease process, condition or treatment will be avoided or minimized Outcome: Adequate for Discharge   Problem: Education: Goal: Knowledge of condition will improve Outcome: Adequate for Discharge Goal: Individualized Educational Video(s) Outcome: Adequate for Discharge Goal: Individualized Newborn Educational Video(s) Outcome: Adequate for  Discharge   Problem: Activity: Goal: Will verbalize the importance of balancing activity with adequate rest periods Outcome: Adequate for Discharge Goal: Ability to tolerate increased activity will improve Outcome: Adequate for Discharge   Problem: Coping: Goal: Ability to identify and utilize available resources and services will improve Outcome: Adequate for Discharge   Problem: Life Cycle: Goal: Chance of risk for complications during the postpartum period will decrease Outcome: Adequate for Discharge   Problem: Role Relationship: Goal: Ability to demonstrate positive interaction with newborn will improve Outcome: Adequate for Discharge   Problem: Skin Integrity: Goal: Demonstration of wound healing without infection will improve Outcome: Adequate for Discharge   

## 2022-06-10 NOTE — Progress Notes (Addendum)
OB Note Platelets down some. Lovenox every night so not due today. I spoke to [facility] and their hematologist will page me when available to see about anticoagulation recs  Patient fine for d/c to home otherwise today.   Cornelia Copa MD Attending Center for [employer] (Faculty Practice) 06/10/2022 Time: 0945  I spoke to Dr. Joseph Art and he recommends continuing on ppx lovenox. Usual threshold is 50k. 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 He will set up f/u with his office for sometime in the first week in August  I d/w pt re: this and that they will contact her for further f/u. Medications ordered. She is hemodynanically stable and she had a normal H/H drop after a c-section but, unfortunately, she started off on low. She has already received IV venofer 500 on 7/23. Given potential risk of bleeding, although small on ppx dosing, I told her I recommend a unit of blood to give her some reserve in case she has bleeding issues on the lovenox. R/b/a d/w her and she is amenable to 1U PRBC  Okay for d/c to home after transfusion  Cornelia Copa MD Attending Center for [facility] (Faculty Practice) 06/10/2022 Time: 1125am

## 2022-06-10 NOTE — Plan of Care (Signed)
  Problem: Health Behavior/Discharge Planning: Goal: Ability to manage health-related needs will improve Outcome: Adequate for Discharge   Problem: Clinical Measurements: Goal: Ability to maintain clinical measurements within normal limits will improve Outcome: Adequate for Discharge Goal: Will remain free from infection Outcome: Adequate for Discharge Goal: Diagnostic test results will improve Outcome: Adequate for Discharge   Problem: Activity: Goal: Risk for activity intolerance will decrease Outcome: Adequate for Discharge   Problem: Nutrition: Goal: Adequate nutrition will be maintained Outcome: Adequate for Discharge   Problem: Coping: Goal: Level of anxiety will decrease Outcome: Adequate for Discharge   Problem: Elimination: Goal: Will not experience complications related to bowel motility Outcome: Adequate for Discharge   Problem: Pain Managment: Goal: General experience of comfort will improve Outcome: Adequate for Discharge   Problem: Safety: Goal: Ability to remain free from injury will improve Outcome: Adequate for Discharge   Problem: Skin Integrity: Goal: Risk for impaired skin integrity will decrease Outcome: Adequate for Discharge   Problem: Education: Goal: Knowledge of disease or condition will improve Outcome: Adequate for Discharge Goal: Knowledge of the prescribed therapeutic regimen will improve Outcome: Adequate for Discharge   Problem: Fluid Volume: Goal: Peripheral tissue perfusion will improve Outcome: Adequate for Discharge   Problem: Clinical Measurements: Goal: Complications related to disease process, condition or treatment will be avoided or minimized Outcome: Adequate for Discharge   Problem: Education: Goal: Knowledge of condition will improve Outcome: Adequate for Discharge Goal: Individualized Educational Video(s) Outcome: Adequate for Discharge Goal: Individualized Newborn Educational Video(s) Outcome: Adequate for  Discharge   Problem: Activity: Goal: Will verbalize the importance of balancing activity with adequate rest periods Outcome: Adequate for Discharge Goal: Ability to tolerate increased activity will improve Outcome: Adequate for Discharge   Problem: Coping: Goal: Ability to identify and utilize available resources and services will improve Outcome: Adequate for Discharge   Problem: Life Cycle: Goal: Chance of risk for complications during the postpartum period will decrease Outcome: Adequate for Discharge   Problem: Role Relationship: Goal: Ability to demonstrate positive interaction with newborn will improve Outcome: Adequate for Discharge   Problem: Skin Integrity: Goal: Demonstration of wound healing without infection will improve Outcome: Adequate for Discharge

## 2022-06-11 ENCOUNTER — Ambulatory Visit: Payer: Self-pay

## 2022-06-11 ENCOUNTER — Encounter (HOSPITAL_COMMUNITY): Payer: Self-pay | Admitting: Obstetrics and Gynecology

## 2022-06-11 ENCOUNTER — Ambulatory Visit: Payer: Medicaid Other

## 2022-06-11 ENCOUNTER — Telehealth: Payer: Medicaid Other | Admitting: Obstetrics and Gynecology

## 2022-06-11 DIAGNOSIS — O135 Gestational [pregnancy-induced] hypertension without significant proteinuria, complicating the puerperium: Secondary | ICD-10-CM | POA: Diagnosis not present

## 2022-06-11 DIAGNOSIS — D696 Thrombocytopenia, unspecified: Secondary | ICD-10-CM | POA: Diagnosis not present

## 2022-06-11 LAB — TYPE AND SCREEN
ABO/RH(D): A POS
Antibody Screen: NEGATIVE
Unit division: 0

## 2022-06-11 LAB — BPAM RBC
Blood Product Expiration Date: 202308112359
ISSUE DATE / TIME: 202307251311
Unit Type and Rh: 6200

## 2022-06-11 NOTE — Lactation Note (Signed)
This note was copied from a baby's chart.  NICU Lactation Consultation Note  Patient Name: Sonya Tapia ESLPN'P Date: 06/11/2022 Age:31 years   Subjective Reason for consult: Follow-up assessment Mother is experiencing breast fullness but has not pumped today. I provided missing pump pieces so that she can pump while in NICU. We reviewed pumping suggestions and volume norms.   Objective Infant data:  Infant feeding assessment Maternal data: G2P2002  C-Section, Low Transverse  Pumping frequency: 3xday Pumped volume: 5 mL  Risk factor for low milk supply:: infrequent pumping   Pump: Personal, DEBP (Motif DEBP at home)  Assessment Infant: Feeding Status: NPO   Intervention/Plan Interventions: Education  Plan: Consult Status: NICU follow-up  NICU Follow-up type: Verify onset of copious milk; Verify absence of engorgement; Weekly NICU follow up  Mother to pump q3h and bring any EBM to NICU  Elder Negus 06/11/2022, 12:33 PM

## 2022-06-12 ENCOUNTER — Inpatient Hospital Stay (HOSPITAL_COMMUNITY)
Admission: EM | Admit: 2022-06-12 | Discharge: 2022-06-17 | DRG: 776 | Disposition: A | Discharge status: Home / Self Care | Payer: Medicaid Other | Attending: Obstetrics and Gynecology | Admitting: Obstetrics and Gynecology

## 2022-06-12 ENCOUNTER — Emergency Department (HOSPITAL_COMMUNITY): Payer: Medicaid Other

## 2022-06-12 ENCOUNTER — Other Ambulatory Visit: Payer: Self-pay

## 2022-06-12 DIAGNOSIS — O99893 Other specified diseases and conditions complicating puerperium: Secondary | ICD-10-CM | POA: Diagnosis present

## 2022-06-12 DIAGNOSIS — Z86718 Personal history of other venous thrombosis and embolism: Secondary | ICD-10-CM | POA: Diagnosis not present

## 2022-06-12 DIAGNOSIS — D696 Thrombocytopenia, unspecified: Secondary | ICD-10-CM | POA: Diagnosis present

## 2022-06-12 DIAGNOSIS — M7981 Nontraumatic hematoma of soft tissue: Secondary | ICD-10-CM | POA: Diagnosis not present

## 2022-06-12 DIAGNOSIS — O135 Gestational [pregnancy-induced] hypertension without significant proteinuria, complicating the puerperium: Secondary | ICD-10-CM | POA: Diagnosis present

## 2022-06-12 DIAGNOSIS — O165 Unspecified maternal hypertension, complicating the puerperium: Secondary | ICD-10-CM | POA: Diagnosis present

## 2022-06-12 DIAGNOSIS — N179 Acute kidney failure, unspecified: Secondary | ICD-10-CM | POA: Diagnosis present

## 2022-06-12 DIAGNOSIS — A419 Sepsis, unspecified organism: Secondary | ICD-10-CM | POA: Diagnosis not present

## 2022-06-12 DIAGNOSIS — O8612 Endometritis following delivery: Secondary | ICD-10-CM | POA: Diagnosis present

## 2022-06-12 DIAGNOSIS — Z98891 History of uterine scar from previous surgery: Secondary | ICD-10-CM

## 2022-06-12 DIAGNOSIS — D563 Thalassemia minor: Secondary | ICD-10-CM | POA: Diagnosis present

## 2022-06-12 DIAGNOSIS — S301XXD Contusion of abdominal wall, subsequent encounter: Secondary | ICD-10-CM | POA: Diagnosis not present

## 2022-06-12 DIAGNOSIS — Q6211 Congenital occlusion of ureteropelvic junction: Secondary | ICD-10-CM | POA: Diagnosis not present

## 2022-06-12 DIAGNOSIS — O904 Postpartum acute kidney failure: Secondary | ICD-10-CM | POA: Diagnosis present

## 2022-06-12 DIAGNOSIS — I10 Essential (primary) hypertension: Secondary | ICD-10-CM

## 2022-06-12 DIAGNOSIS — O1200 Gestational edema, unspecified trimester: Secondary | ICD-10-CM

## 2022-06-12 DIAGNOSIS — Z7901 Long term (current) use of anticoagulants: Secondary | ICD-10-CM

## 2022-06-12 DIAGNOSIS — Z20822 Contact with and (suspected) exposure to covid-19: Secondary | ICD-10-CM | POA: Diagnosis present

## 2022-06-12 DIAGNOSIS — S301XXA Contusion of abdominal wall, initial encounter: Secondary | ICD-10-CM | POA: Diagnosis present

## 2022-06-12 DIAGNOSIS — R509 Fever, unspecified: Secondary | ICD-10-CM | POA: Diagnosis not present

## 2022-06-12 DIAGNOSIS — N1330 Unspecified hydronephrosis: Secondary | ICD-10-CM | POA: Diagnosis present

## 2022-06-12 HISTORY — DX: Contusion of abdominal wall, initial encounter: S30.1XXA

## 2022-06-12 LAB — APTT: aPTT: 33 seconds (ref 24–36)

## 2022-06-12 LAB — COMPREHENSIVE METABOLIC PANEL
ALT: 12 U/L (ref 0–44)
AST: 24 U/L (ref 15–41)
Albumin: 2.7 g/dL — ABNORMAL LOW (ref 3.5–5.0)
Alkaline Phosphatase: 73 U/L (ref 38–126)
Anion gap: 8 (ref 5–15)
BUN: 7 mg/dL (ref 6–20)
CO2: 23 mmol/L (ref 22–32)
Calcium: 8.8 mg/dL — ABNORMAL LOW (ref 8.9–10.3)
Chloride: 108 mmol/L (ref 98–111)
Creatinine, Ser: 0.93 mg/dL (ref 0.44–1.00)
GFR, Estimated: >60 mL/min (ref >60)
Glucose, Bld: 100 mg/dL — ABNORMAL HIGH (ref 70–99)
Potassium: 3.9 mmol/L (ref 3.5–5.1)
Sodium: 139 mmol/L (ref 135–145)
Total Bilirubin: 1 mg/dL (ref 0.3–1.2)
Total Protein: 6 g/dL — ABNORMAL LOW (ref 6.5–8.1)

## 2022-06-12 LAB — URINALYSIS, ROUTINE W REFLEX MICROSCOPIC
Bilirubin Urine: NEGATIVE
Glucose, UA: NEGATIVE mg/dL
Ketones, ur: NEGATIVE mg/dL
Nitrite: NEGATIVE
Protein, ur: NEGATIVE mg/dL
Specific Gravity, Urine: 1.009 (ref 1.005–1.030)
pH: 8 (ref 5.0–8.0)

## 2022-06-12 LAB — CBC WITH DIFFERENTIAL/PLATELET
Abs Immature Granulocytes: 0.08 10*3/uL — ABNORMAL HIGH (ref 0.00–0.07)
Basophils Absolute: 0 10*3/uL (ref 0.0–0.1)
Basophils Relative: 0 %
Eosinophils Absolute: 0.1 10*3/uL (ref 0.0–0.5)
Eosinophils Relative: 1 %
HCT: 28.6 % — ABNORMAL LOW (ref 36.0–46.0)
Hemoglobin: 10.1 g/dL — ABNORMAL LOW (ref 12.0–15.0)
Immature Granulocytes: 1 %
Lymphocytes Relative: 9 %
Lymphs Abs: 1.2 10*3/uL (ref 0.7–4.0)
MCH: 28.5 pg (ref 26.0–34.0)
MCHC: 35.3 g/dL (ref 30.0–36.0)
MCV: 80.8 fL (ref 80.0–100.0)
Monocytes Absolute: 0.4 10*3/uL (ref 0.1–1.0)
Monocytes Relative: 3 %
Neutro Abs: 11.4 10*3/uL — ABNORMAL HIGH (ref 1.7–7.7)
Neutrophils Relative %: 86 %
Platelets: 111 10*3/uL — ABNORMAL LOW (ref 150–400)
RBC: 3.54 MIL/uL — ABNORMAL LOW (ref 3.87–5.11)
RDW: 14.4 % (ref 11.5–15.5)
WBC: 13.1 10*3/uL — ABNORMAL HIGH (ref 4.0–10.5)
nRBC: 0.2 % (ref 0.0–0.2)

## 2022-06-12 LAB — PROTIME-INR
INR: 1.1 (ref 0.8–1.2)
Prothrombin Time: 14.2 seconds (ref 11.4–15.2)

## 2022-06-12 LAB — LACTIC ACID, PLASMA
Lactic Acid, Venous: 1.3 mmol/L (ref 0.5–1.9)
Lactic Acid, Venous: 1.6 mmol/L (ref 0.5–1.9)

## 2022-06-12 LAB — I-STAT BETA HCG BLOOD, ED (NOT ORDERABLE): I-stat hCG, quantitative: 1247.2 m[IU]/mL — ABNORMAL HIGH (ref <5)

## 2022-06-12 LAB — RESP PANEL BY RT-PCR (FLU A&B, COVID) ARPGX2
Influenza A by PCR: NEGATIVE
Influenza B by PCR: NEGATIVE
SARS Coronavirus 2 by RT PCR: NEGATIVE

## 2022-06-12 LAB — LIPASE, BLOOD: Lipase: 29 U/L (ref 11–51)

## 2022-06-12 MED ORDER — FUROSEMIDE 20 MG PO TABS: 20.0000 mg | ORAL_TABLET | Freq: Two times a day (BID) | ORAL | 0 refills | Status: DC

## 2022-06-12 MED ORDER — SIMETHICONE 80 MG PO CHEW: 80.0000 mg | CHEWABLE_TABLET | Freq: Two times a day (BID) | ORAL | Status: DC

## 2022-06-12 MED ORDER — HYDROMORPHONE HCL 1 MG/ML IJ SOLN
1.0000 mg | Freq: Once | INTRAMUSCULAR | Status: AC
  Administered 2022-06-12: 1 mg via INTRAVENOUS
  Filled 2022-06-12: qty 1

## 2022-06-12 MED ORDER — ONDANSETRON HCL 4 MG/2ML IJ SOLN: 4.0000 mg | Freq: Four times a day (QID) | INTRAMUSCULAR | Status: DC | PRN

## 2022-06-12 MED ORDER — HYDROMORPHONE HCL 1 MG/ML IJ SOLN: 0.2000 mg | INTRAMUSCULAR | Status: DC | PRN

## 2022-06-12 MED ORDER — LABETALOL HCL 200 MG PO TABS
300.0000 mg | ORAL_TABLET | Freq: Two times a day (BID) | ORAL | Status: DC
  Administered 2022-06-12 – 2022-06-17 (×10): 300 mg via ORAL
  Filled 2022-06-12 (×10): qty 1

## 2022-06-12 MED ORDER — SODIUM CHLORIDE 0.9 % IV SOLN: 2.0000 g | Freq: Once | INTRAVENOUS | Status: DC

## 2022-06-12 MED ORDER — PIPERACILLIN-TAZOBACTAM 3.375 G IVPB 30 MIN
3.3750 g | Freq: Once | INTRAVENOUS | Status: AC
Start: 2022-06-12 — End: 2022-06-12
  Administered 2022-06-12: 3.375 g via INTRAVENOUS
  Filled 2022-06-12: qty 50

## 2022-06-12 MED ORDER — IOHEXOL 300 MG/ML  SOLN
100.0000 mL | Freq: Once | INTRAMUSCULAR | Status: AC | PRN
  Administered 2022-06-12: 100 mL via INTRAVENOUS

## 2022-06-12 MED ORDER — PIPERACILLIN-TAZOBACTAM 3.375 G IVPB
3.3750 g | Freq: Three times a day (TID) | INTRAVENOUS | Status: DC
  Administered 2022-06-12 – 2022-06-16 (×12): 3.375 g via INTRAVENOUS
  Filled 2022-06-12 (×15): qty 50

## 2022-06-12 MED ORDER — GABAPENTIN 100 MG PO CAPS
200.0000 mg | ORAL_CAPSULE | Freq: Two times a day (BID) | ORAL | Status: DC
Start: 2022-06-12 — End: 2022-06-17
  Administered 2022-06-12 – 2022-06-15 (×6): 200 mg via ORAL
  Filled 2022-06-12 (×11): qty 2

## 2022-06-12 MED ORDER — ACETAMINOPHEN 325 MG PO TABS
650.0000 mg | ORAL_TABLET | Freq: Four times a day (QID) | ORAL | Status: DC | PRN
  Administered 2022-06-12 – 2022-06-14 (×5): 650 mg via ORAL
  Filled 2022-06-12 (×6): qty 2

## 2022-06-12 MED ORDER — LACTATED RINGERS IV SOLN: INTRAVENOUS | Status: AC

## 2022-06-12 MED ORDER — LABETALOL HCL 200 MG PO TABS
300.0000 mg | ORAL_TABLET | Freq: Once | ORAL | Status: AC
  Administered 2022-06-12: 300 mg via ORAL
  Filled 2022-06-12: qty 2

## 2022-06-12 MED ORDER — FOLIC ACID 1 MG PO TABS
1.0000 mg | ORAL_TABLET | Freq: Every day | ORAL | Status: DC
  Administered 2022-06-13 – 2022-06-17 (×5): 1 mg via ORAL
  Filled 2022-06-12 (×5): qty 1

## 2022-06-12 MED ORDER — SENNA 8.6 MG PO TABS: 1.0000 | ORAL_TABLET | Freq: Every evening | ORAL | Status: DC | PRN

## 2022-06-12 MED ORDER — PANTOPRAZOLE SODIUM 40 MG PO TBEC
40.0000 mg | DELAYED_RELEASE_TABLET | Freq: Every day | ORAL | Status: DC
Start: 2022-06-12 — End: 2022-06-17
  Administered 2022-06-12 – 2022-06-16 (×4): 40 mg via ORAL
  Filled 2022-06-12 (×6): qty 1

## 2022-06-12 MED ORDER — OXYCODONE HCL 5 MG PO TABS
5.0000 mg | ORAL_TABLET | ORAL | Status: DC | PRN
  Administered 2022-06-12: 5 mg via ORAL
  Administered 2022-06-13 – 2022-06-14 (×3): 10 mg via ORAL
  Administered 2022-06-15: 5 mg via ORAL
  Administered 2022-06-16: 10 mg via ORAL
  Filled 2022-06-12: qty 1
  Filled 2022-06-12 (×5): qty 2

## 2022-06-12 MED ORDER — ONDANSETRON HCL 4 MG PO TABS: 4.0000 mg | ORAL_TABLET | Freq: Four times a day (QID) | ORAL | Status: DC | PRN

## 2022-06-12 MED ORDER — SODIUM CHLORIDE 0.9 % IV BOLUS
1000.0000 mL | Freq: Once | INTRAVENOUS | Status: AC
  Administered 2022-06-12: 1000 mL via INTRAVENOUS

## 2022-06-12 MED ORDER — POLYETHYLENE GLYCOL 3350 17 G PO PACK
17.0000 g | PACK | Freq: Every day | ORAL | Status: DC
Start: 2022-06-12 — End: 2022-06-17
  Administered 2022-06-12 – 2022-06-16 (×5): 17 g via ORAL
  Filled 2022-06-12 (×5): qty 1

## 2022-06-12 MED ORDER — FUROSEMIDE 20 MG PO TABS
20.0000 mg | ORAL_TABLET | Freq: Two times a day (BID) | ORAL | Status: DC
Start: 2022-06-12 — End: 2022-06-17
  Administered 2022-06-13 – 2022-06-17 (×9): 20 mg via ORAL
  Filled 2022-06-12 (×11): qty 1

## 2022-06-12 MED ORDER — METRONIDAZOLE 500 MG/100ML IV SOLN: 500.0000 mg | Freq: Once | INTRAVENOUS | Status: DC

## 2022-06-12 MED ORDER — LACTATED RINGERS IV SOLN: INTRAVENOUS | Status: DC

## 2022-06-12 MED ORDER — SIMETHICONE 80 MG PO CHEW
80.0000 mg | CHEWABLE_TABLET | Freq: Four times a day (QID) | ORAL | Status: DC | PRN
  Administered 2022-06-14: 80 mg via ORAL
  Filled 2022-06-12: qty 1

## 2022-06-12 MED ORDER — FUROSEMIDE 10 MG/ML IJ SOLN
10.0000 mg | INTRAMUSCULAR | Status: AC
  Administered 2022-06-12: 10 mg via INTRAVENOUS
  Filled 2022-06-12: qty 2

## 2022-06-12 MED ORDER — LABETALOL HCL 300 MG PO TABS: 300.0000 mg | ORAL_TABLET | Freq: Two times a day (BID) | ORAL | 0 refills | Status: DC

## 2022-06-12 MED ORDER — ENOXAPARIN SODIUM 60 MG/0.6ML IJ SOSY
0.5000 mg/kg | PREFILLED_SYRINGE | INTRAMUSCULAR | Status: DC
  Administered 2022-06-12: 45 mg via SUBCUTANEOUS
  Filled 2022-06-12: qty 0.45
  Filled 2022-06-12: qty 0.6

## 2022-06-12 NOTE — Progress Notes (Signed)
Elink following code sepsis °

## 2022-06-12 NOTE — ED Notes (Signed)
IV team at bedside 

## 2022-06-12 NOTE — ED Provider Triage Note (Signed)
Emergency Medicine Provider Triage Evaluation Note  Sonya Tapia , a 31 y.o. female  was evaluated in triage.  Pt complains of left upper quadrant abdominal pain for the last 1 day.  The patient states that she gave birth 4 days ago, cesarean section where her uterus ruptured.  Patient states that she was discharged 2 days ago.  Patient reports that pain does not radiate, is worsened with movement, feels a "ball".  Patient denies fevers, nausea, vomiting, diarrhea, bodyaches or chills.  Review of Systems  Positive:  Negative:   Physical Exam  BP (!) 160/98 (BP Location: Right Arm)   Pulse (!) 122   Temp 99.3 F (37.4 C) (Oral)   Resp 17   LMP 09/17/2021   SpO2 92%  Gen:   Awake, no distress   Resp:  Normal effort  MSK:   Moves extremities without difficulty  Other:  Guarding left upper quadrant  Medical Decision Making  Medically screening exam initiated at 9:31 AM.  Appropriate orders placed.  Sonya Tapia was informed that the remainder of the evaluation will be completed by another provider, this initial triage assessment does not replace that evaluation, and the importance of remaining in the ED until their evaluation is complete.     Al Decant, PA-C 06/12/22 [phone]

## 2022-06-12 NOTE — H&P (Signed)
Obstetrics & Gynecology H&P   Date of Admission: 06/12/2022   Requesting Provider: [facility]  Primary OBGYN: [facility] Primary Care Provider: Pcp, No  Reason for Admission: PP endometritis  History of Present Illness: Sonya Tapia is a 31 y.o. [id number] POD#4 s/p emergency c-section for uterine rupture after IOL GHTN at 37wks, with the above CC; pt was discharge to home yesterday from [facility]  PMHx is significant for thrombocytopenia, h/o estrogen induced DVT, Cleora disease, pt on ppx lovenox   Patient presented with malaise and left sided belly pain to the ED. She had a CT that showed a moderately sized left sided RS hematoma but no e/o active bleeding and she had a temp to 102.  OBGYN called and recommend full work up, zosyn and admission to [facility] specialty care. She did receive ancef and azithro during her c-section  Patient in OBSC now and feels better, +normal lochia  ROS: A 12-point review of systems was performed and negative, except as stated in the above HPI.  OBGYN History: As per HPI. OB History  Gravida Para Term Preterm AB Living  2 2 2     2   SAB IAB Ectopic Multiple Live Births        0 2    # Outcome Date GA Lbr Len/2nd Weight Sex Delivery Anes PTL Lv  2 Term 06/07/22 [redacted]w[redacted]d  3440 g M CS-LTranv EPI  LIV  1 Term 03/26/20   3629 g M CS-Unspec  N LIV     Past Medical History: Past Medical History:  Diagnosis Date   DVT (deep venous thrombosis) (HCC)    Pregnancy induced hypertension    Sickle cell anemia (HCC)     Past Surgical History: Past Surgical History:  Procedure Laterality Date   CESAREAN SECTION     CESAREAN SECTION N/A 06/07/2022   Procedure: CESAREAN SECTION;  Surgeon: 06/09/2022, MD;  Location: [facility] LD ORS;  Service: Obstetrics;  Laterality: N/A;    Family History:  Family History  Problem Relation Age of Onset   Hypertension Mother    Healthy Father     Social History:  Social History   Socioeconomic History   Marital status: Single     Spouse name: Not on file   Number of children: 1   Years of education: Not on file   Highest education level: Not on file  Occupational History   Not on file  Tobacco Use   Smoking status: Never   Smokeless tobacco: Never  Vaping Use   Vaping Use: Never used  Substance and Sexual Activity   Alcohol use: Not Currently    Comment: not while preg   Drug use: Never   Sexual activity: Yes    Birth control/protection: Pill    Comment: spoke with OB about it at a previous appointment  Other Topics Concern   Not on file  Social History Narrative   Not on file   Social Determinants of Health   Financial Resource Strain: Not on file  Food Insecurity: Food Insecurity Present (04/24/2022)   Hunger Vital Sign    Worried About Running Out of Food in the Last Year: Sometimes true    Ran Out of Food in the Last Year: Never true  Transportation Needs: No Transportation Needs (04/24/2022)   PRAPARE - 06/24/2022 (Medical): No    Lack of Transportation (Non-Medical): No  Physical Activity: Not on file  Stress: Not on file  Social Connections:  Not on file  Intimate Partner Violence: Not on file    Allergy: No Known Allergies  Current Outpatient Medications: Medications Prior to Admission  Medication Sig Dispense Refill Last Dose   enoxaparin (LOVENOX) 40 MG/0.4ML injection Inject 1 syringe (40 mg total) into the skin daily. 16.8 mL 0    ergocalciferol (VITAMIN D2) 1.25 MG (50000 UT) capsule Take 50,000 Units by mouth once a week.      ferrous sulfate (FERROUSUL) 325 (65 FE) MG tablet Take 1 tablet (325 mg total) by mouth every other day. 60 tablet 1    FOLIC ACID PO Take 1 capsule by mouth daily.      oxyCODONE-acetaminophen (PERCOCET/ROXICET) 5-325 MG tablet Take 1 tablet by mouth every 6 (six) hours as needed. 30 tablet 0    Prenatal Vit-Fe Fumarate-FA (MULTIVITAMIN-PRENATAL) 27-0.8 MG TABS tablet Take 1 tablet by mouth daily at 12 noon.      senna-docusate  (SENOKOT-S) 8.6-50 MG tablet Take 2 tablets by mouth at bedtime as needed for mild constipation. 10 tablet 0    simethicone (MYLICON) 80 MG chewable tablet Chew 1 tablet (80 mg total) by mouth 4 (four) times daily as needed for flatulence. 30 tablet 0      Hospital Medications: Current Facility-Administered Medications  Medication Dose Route Frequency Provider Last Rate Last Admin   acetaminophen (TYLENOL) tablet 650 mg  650 mg Oral Q6H PRN [place] Bing, MD   650 mg at 06/12/22 1349   enoxaparin (LOVENOX) injection 45 mg  0.5 mg/kg Subcutaneous Q24H [place] Bing, MD       [START ON 06/13/2022] folic acid (FOLVITE) tablet 1 mg  1 mg Oral Daily [place] Bing, MD       furosemide (LASIX) tablet 20 mg  20 mg Oral BID [place] Bing, MD       gabapentin (NEURONTIN) capsule 200 mg  200 mg Oral BID [place] Bing, MD       HYDROmorphone (DILAUDID) injection 0.2-0.6 mg  0.2-0.6 mg Intravenous Q2H PRN [place] Bing, MD       labetalol (NORMODYNE) tablet 300 mg  300 mg Oral BID [place] Bing, MD       lactated ringers infusion   Intravenous Continuous [place] Bing, MD 100 mL/hr at 06/12/22 1550 New Bag at 06/12/22 1550   ondansetron (ZOFRAN) tablet 4 mg  4 mg Oral Q6H PRN [place] Bing, MD       Or   ondansetron (ZOFRAN) injection 4 mg  4 mg Intravenous Q6H PRN [place] Bing, MD       oxyCODONE (Oxy IR/ROXICODONE) immediate release tablet 5-10 mg  5-10 mg Oral Q4H PRN [place] Bing, MD       pantoprazole (PROTONIX) EC tablet 40 mg  40 mg Oral Daily [place] Bing, MD       piperacillin-tazobactam (ZOSYN) IVPB 3.375 g  3.375 g Intravenous Q8H [place] Yeatts, Billey Gosling, MD       polyethylene glycol (MIRALAX / GLYCOLAX) packet 17 g  17 g Oral Daily [place] Bing, MD       senna (SENOKOT) tablet 8.6 mg  1 tablet Oral QHS PRN [place]e Bing, MD       simethicone (MYLICON) chewable tablet 80 mg  80 mg Oral QID PRN [place] Bing, MD       simethicone (MYLICON) chewable  tablet 80 mg  80 mg Oral BID AC  Bing, MD         Physical Exam:    Current Vital Signs 24h Vital Sign Ranges  T  99.5 F (37.5 C) Temp  Avg: 100.4 F (38 C)  Min: 99.3 F (37.4 C)  Max: 102.9 F (39.4 C)  BP 128/76 BP  Min: 118/70  Max: 160/98  HR (!) 115 Pulse  Avg: 117.2  Min: 107  Max: 125  RR 20 Resp  Avg: 24.4  Min: 16  Max: 33  SaO2 99 % Room Air SpO2  Avg: 98.8 %  Min: 92 %  Max: 100 %       24 Hour I/O Current Shift I/O  Time Ins Outs No intake/output data recorded. No intake/output data recorded.   Patient Vitals for the past 24 hrs:  BP Temp Temp src Pulse Resp SpO2  06/12/22 1654 128/76 99.5 F (37.5 C) Oral (!) 115 20 99 %  06/12/22 1600 122/79 -- -- (!) 116 (!) 25 99 %  06/12/22 1459 -- 99.7 F (37.6 C) Oral -- -- --  06/12/22 1445 118/70 -- -- (!) 119 (!) 33 99 %  06/12/22 1415 131/86 -- -- (!) 122 16 98 %  06/12/22 1345 (!) 155/98 -- -- (!) 110 (!) 33 100 %  06/12/22 1315 (!) 141/93 (!) 102.9 F (39.4 C) Oral (!) 117 (!) 28 100 %  06/12/22 1300 (!) 139/93 -- -- (!) 123 (!) 30 100 %  06/12/22 1100 (!) 146/97 -- -- (!) 107 (!) 24 100 %  06/12/22 1040 (!) 141/93 -- -- (!) 125 -- 100 %  06/12/22 1015 (!) 155/97 -- -- (!) 113 18 100 %  06/12/22 0924 (!) 160/98 99.3 F (37.4 C) Oral (!) 122 17 92 %    There is no height or weight on file to calculate BMI. General appearance: Well nourished, well developed female in no acute distress.  Cardiovascular: S1, S2 normal, no murmur, rub or gallop, regular rate and rhythm Respiratory:  Clear to auscultation bilateral. Normal respiratory effort Abdomen: soft, appropriately ttp at the incision and some luq tenderness. Honeycomb dressing in place and incision c/d/i Neuro/Psych:  Normal mood and affect.  Skin:  Warm and dry.  Extremities: no clubbing, cyanosis, or edema.   Laboratory: Recent Labs  Lab 06/08/22 1211 06/10/22 0825 06/12/22 0950  WBC 11.6* 7.6 13.1*  HGB 9.4* 7.1* 10.1*  HCT 26.3*  19.6* 28.6*  PLT 90* 81* 111*   Recent Labs  Lab 06/07/22 0330 06/12/22 0950  NA 133* 139  K 3.7 3.9  CL 105 108  CO2 19* 23  BUN <5* 7  CREATININE 0.93 0.93  CALCIUM 8.8* 8.8*  PROT 6.2* 6.0*  BILITOT 1.7* 1.0  ALKPHOS 124 73  ALT 11 12  AST 24 24  GLUCOSE 70 100*   Recent Labs  Lab 06/12/22 1435  APTT 33  INR 1.1   Recent Labs  Lab 06/10/22 0825  ABORH A POS Performed at [facility], [address] [place], [place] [address]    Negative: respiratory panel, u/a (blood and leuks), lipase  Pending: UCx, BCx x 2,   Imaging:  Neg cxr  Narrative & Impression  CLINICAL DATA:  Four days postpartum. Left upper quadrant abdominal pain.   EXAM: CT ABDOMEN AND PELVIS WITH CONTRAST   TECHNIQUE: Multidetector CT imaging of the abdomen and pelvis was performed using the standard protocol following bolus administration of intravenous contrast.   RADIATION DOSE REDUCTION: This exam was performed according to the departmental dose-optimization program which includes automated exposure control, adjustment of the mA and/or kV according to patient size and/or use of  iterative reconstruction technique.   CONTRAST:  OMNIPAQUE IOHEXOL 300 MG/ML  SOLN   COMPARISON:  None Available.   FINDINGS: Lower chest: Breathing motion artifact but no infiltrates or effusions. No pneumothorax. Streaky bibasilar atelectasis.   Hepatobiliary: No hepatic lesions or intrahepatic biliary dilatation. The gallbladder is unremarkable. No common bile duct dilatation.   Pancreas: No mass, inflammation or ductal dilatation.   Spleen: Marked splenomegaly. The spleen measures 18 x 15 x 10 cm. No findings for splenic infarction.   Adrenals/Urinary Tract: Adrenal glands and left kidney are unremarkable. The right kidney demonstrates marked hydronephrosis. There is significant hydroureter down to the pelvis where the ureter is likely compressed by the enlarged uterus. The  bladder is unremarkable.   Stomach/Bowel: The stomach, duodenum, small bowel and colon are grossly normal.   Vascular/Lymphatic: The aorta and branch vessels are normal. The major venous structures are patent.   Reproductive: Enlarged postpartum uterus small blush of contrast in the upper left endometrial area could be a site of active bleeding. Recommend correlation with any vaginal bleeding.   Other: The left rectus and oblique abdominal muscles are markedly enlarged and edematous. There appears to be sizable hematoma involving the lower left rectus muscle likely accounting for the patient's pain. I do not see any active extravasation of contrast material to suggest active bleed. Some gas is noted in the oblique abdominal muscles bilaterally likely related to the patient's surgery.   Musculoskeletal: No significant bony findings.   IMPRESSION: 1. Enlarged postpartum uterus with a small blush of contrast in the upper left endometrial area could be a site of active bleeding. Recommend correlation with any vaginal bleeding. 2. Large hematoma involving the left rectus muscle extending into the left oblique abdominal muscles likely accounting for the patient's left lower quadrant pain. 3. Marked splenomegaly. 4. Marked right-sided hydronephrosis and hydroureter down to the pelvis where the ureter is likely compressed by the enlarged uterus.     Electronically Signed   By: Rudie Meyer M.D.   On: 06/12/2022 12:27   Assessment: pt stable with PP endometritis.   Plan: Zosyn, f/u cultures, repeat labs in morning. Continue ppx lovenox likely saturday  Total time taking care of the patient was 30 minutes, with greater than 50% of the time spent in face to face interaction with the patient.  Cornelia Copa MD Attending Center for [facility] (Faculty Practice) GYN Consult Phone: [phone] (M-F, [phone]) & [phone] (Off hours, weekends, holidays)

## 2022-06-12 NOTE — Progress Notes (Signed)
Pharmacy Antibiotic Note  Sonya Tapia is a 31 y.o. female for which pharmacy has been consulted for zosyn dosing for concern for postoperative infection.  Patient with a history of sickle cell disease, C-section performed approximately 4 days ago. Patient presenting with abdominal pain.  Estimated Creatinine Clearance: 98.2 mL/min (by C-G formula based on SCr of 0.93 mg/dL).  WBC 13.1; T 102.9 F; HR 117; RR 28  Plan: Zosyn 3.375g IV q8h (4 hour infusion) Trend WBC, Fever, Renal function, & Clinical course F/u cultures, clinical course, WBC, fever De-escalate when able     Temp (24hrs), Avg:101.1 F (38.4 C), Min:99.3 F (37.4 C), Max:102.9 F (39.4 C)  Recent Labs  Lab 06/07/22 0330 06/07/22 0940 06/07/22 2222 06/08/22 0555 06/08/22 1211 06/10/22 0825 06/12/22 0950  WBC 6.8   < > 8.2 9.8 11.6* 7.6 13.1*  CREATININE 0.93  --   --   --   --   --  0.93   < > = values in this interval not displayed.    Estimated Creatinine Clearance: 98.2 mL/min (by C-G formula based on SCr of 0.93 mg/dL).    No Known Allergies  Antimicrobials this admission: zosyn 7/27 >>  Microbiology results: Pending  Thank you for allowing pharmacy to be a part of this patient's care.  Delmar Landau, PharmD, BCPS 06/12/2022 1:32 PM ED Clinical Pharmacist -  [phone]

## 2022-06-12 NOTE — ED Triage Notes (Signed)
Pt endorses 8/10 abd. Pain in LUQ. That started yesterday.Pt is 4 days post partum.  HX: 2 prior c section

## 2022-06-12 NOTE — Progress Notes (Signed)
OB Note 06/12/2022 1300 Per ED physician, patient w/o any s/s of pre-eclampsia; pt does have some b/l LE edema. Labs, VS and imaging reviewed. Patient has not received any additional blood products since she was discharged.  Recommend starting lasix and labetalol and f/u at pt's already scheduled incision and BP check tomorrow  Lasix 10 IV x 1 and labetalol 300mg  po x 1 before she leaves the ED Patient to start tonight at home lasix 20mg  po bid x 5 days and labetalol 300mg  po bid x 30 day supply  MD Attending Center for [facility] (Faculty Practice) GYN Consult Phone: [phone] (M-F, [phone]) & [phone] (Off hours, weekends, holidays)

## 2022-06-12 NOTE — ED Provider Notes (Addendum)
[facility] EMERGENCY DEPARTMENT Provider Note   CSN: [id number] Arrival date & time: 06/12/22  2355     History  Chief Complaint  Patient presents with   Abdominal Pain    Sonya Tapia is a 31 y.o. female.   Abdominal Pain   This patient is a 31 year old female, she has a history of sickle cell disease, she had a C-section performed approximately 4 days ago, during the procedure there were some fetal bradycardia and it was noted that she had uterine rupture.  This was repaired, the patient was transfused a unit of packed red blood cells and left the hospital 2 days ago feeling good like she normally does after having a baby.  She states that yesterday while she was breast-feeding she had acute onset of pain in the left upper quadrant which was quite severe and has been rather persistent since that time.  She took a pain medication this morning about 5 hours ago which seems to help a little bit but she has ongoing pain.  She does not have any fevers vomiting or diarrhea.  She has no difficulty breathing or chest pain, she has no back pain or headache.  She does not feel like she is going to pass out.  Patient noted to be tachycardic on arrival.    Home Medications Prior to Admission medications   Medication Sig Start Date End Date Taking? Authorizing Provider  furosemide (LASIX) 20 MG tablet Take 1 tablet (20 mg total) by mouth 2 (two) times daily for 5 days. 06/12/22 06/17/22 Yes Eber Hong, MD  labetalol (NORMODYNE) 300 MG tablet Take 1 tablet (300 mg total) by mouth 2 (two) times daily. 06/12/22 07/12/22 Yes Eber Hong, MD  enoxaparin (LOVENOX) 40 MG/0.4ML injection Inject 1 syringe (40 mg total) into the skin daily. 06/10/22 07/22/22  Woodward Bing, MD  ergocalciferol (VITAMIN D2) 1.25 MG (50000 UT) capsule Take 50,000 Units by mouth once a week.    [provider]  ferrous sulfate (FERROUSUL) 325 (65 FE) MG tablet Take 1 tablet (325 mg total) by  mouth every other day. 02/07/22   Constant, Peggy, MD  FOLIC ACID PO Take 1 capsule by mouth daily.    [provider]  oxyCODONE-acetaminophen (PERCOCET/ROXICET) 5-325 MG tablet Take 1 tablet by mouth every 6 (six) hours as needed. 06/10/22   Zion Bing, MD  Prenatal Vit-Fe Fumarate-FA (MULTIVITAMIN-PRENATAL) 27-0.8 MG TABS tablet Take 1 tablet by mouth daily at 12 noon.    [provider]  senna-docusate (SENOKOT-S) 8.6-50 MG tablet Take 2 tablets by mouth at bedtime as needed for mild constipation. 06/10/22   [place] Bing, MD  simethicone (MYLICON) 80 MG chewable tablet Chew 1 tablet (80 mg total) by mouth 4 (four) times daily as needed for flatulence. 06/10/22   Hennessey Bing, MD      Allergies    Patient has no known allergies.    Review of Systems   Review of Systems  Gastrointestinal:  Positive for abdominal pain.  All other systems reviewed and are negative.   Physical Exam Updated Vital Signs BP (!) 141/93   Pulse (!) 117   Temp (!) 102.9 F (39.4 C) (Oral)   Resp (!) 28   LMP 09/17/2021   SpO2 100%  Physical Exam Vitals and nursing note reviewed.  Constitutional:      General: She is not in acute distress.    Appearance: She is well-developed.  HENT:     Head: Normocephalic and atraumatic.  Mouth/Throat:     Pharynx: No oropharyngeal exudate.  Eyes:     General: No scleral icterus.       Right eye: No discharge.        Left eye: No discharge.     Conjunctiva/sclera: Conjunctivae normal.     Pupils: Pupils are equal, round, and reactive to light.  Neck:     Thyroid: No thyromegaly.     Vascular: No JVD.  Cardiovascular:     Rate and Rhythm: Regular rhythm. Tachycardia present.     Heart sounds: Normal heart sounds. No murmur heard.    No friction rub. No gallop.  Pulmonary:     Effort: Pulmonary effort is normal. No respiratory distress.     Breath sounds: Normal breath sounds. No wheezing or rales.  Abdominal:     General:  Bowel sounds are normal. There is no distension.     Palpations: Abdomen is soft. There is no mass.     Tenderness: There is abdominal tenderness.     Comments: Tenderness in the left mid to upper abdomen, no significant tenderness on the right.  Soft abdomen, C-section scar with appropriate dressing overlying it, no obvious significant bleeding or redness surrounding it, no induration or warmth  Musculoskeletal:        General: No tenderness. Normal range of motion.     Cervical back: Normal range of motion and neck supple.     Right lower leg: Edema present.     Left lower leg: Edema present.     Comments: Mild bilateral lower extremity edema  Lymphadenopathy:     Cervical: No cervical adenopathy.  Skin:    General: Skin is warm and dry.     Findings: No erythema or rash.  Neurological:     Mental Status: She is alert.     Coordination: Coordination normal.  Psychiatric:        Behavior: Behavior normal.     ED Results / Procedures / Treatments   Labs (all labs ordered are listed, but only abnormal results are displayed) Labs Reviewed  COMPREHENSIVE METABOLIC PANEL - Abnormal; Notable for the following components:      Result Value   Glucose, Bld 100 (*)    Calcium 8.8 (*)    Total Protein 6.0 (*)    Albumin 2.7 (*)    All other components within normal limits  URINALYSIS, ROUTINE W REFLEX MICROSCOPIC - Abnormal; Notable for the following components:   Hgb urine dipstick LARGE (*)    Leukocytes,Ua SMALL (*)    Bacteria, UA RARE (*)    All other components within normal limits  CBC WITH DIFFERENTIAL/PLATELET - Abnormal; Notable for the following components:   WBC 13.1 (*)    RBC 3.54 (*)    Hemoglobin 10.1 (*)    HCT 28.6 (*)    Platelets 111 (*)    Neutro Abs 11.4 (*)    Abs Immature Granulocytes 0.08 (*)    All other components within normal limits  RESP PANEL BY RT-PCR (FLU A&B, COVID) ARPGX2  CULTURE, BLOOD (ROUTINE X 2)  CULTURE, BLOOD (ROUTINE X 2)  URINE  CULTURE  LIPASE, BLOOD  LACTIC ACID, PLASMA  LACTIC ACID, PLASMA  PROTIME-INR  APTT  I-STAT BETA HCG BLOOD, ED ([facility], [facility], [facility] ONLY)    EKG None  Radiology CT ABDOMEN PELVIS W CONTRAST  Result Date: 06/12/2022 CLINICAL DATA:  Four days postpartum. Left upper quadrant abdominal pain. EXAM: CT ABDOMEN AND PELVIS WITH CONTRAST TECHNIQUE:  Multidetector CT imaging of the abdomen and pelvis was performed using the standard protocol following bolus administration of intravenous contrast. RADIATION DOSE REDUCTION: This exam was performed according to the departmental dose-optimization program which includes automated exposure control, adjustment of the mA and/or kV according to patient size and/or use of iterative reconstruction technique. CONTRAST:  OMNIPAQUE IOHEXOL 300 MG/ML  SOLN COMPARISON:  None Available. FINDINGS: Lower chest: Breathing motion artifact but no infiltrates or effusions. No pneumothorax. Streaky bibasilar atelectasis. Hepatobiliary: No hepatic lesions or intrahepatic biliary dilatation. The gallbladder is unremarkable. No common bile duct dilatation. Pancreas: No mass, inflammation or ductal dilatation. Spleen: Marked splenomegaly. The spleen measures 18 x 15 x 10 cm. No findings for splenic infarction. Adrenals/Urinary Tract: Adrenal glands and left kidney are unremarkable. The right kidney demonstrates marked hydronephrosis. There is significant hydroureter down to the pelvis where the ureter is likely compressed by the enlarged uterus. The bladder is unremarkable. Stomach/Bowel: The stomach, duodenum, small bowel and colon are grossly normal. Vascular/Lymphatic: The aorta and branch vessels are normal. The major venous structures are patent. Reproductive: Enlarged postpartum uterus small blush of contrast in the upper left endometrial area could be a site of active bleeding. Recommend correlation with any vaginal bleeding. Other: The left rectus and oblique abdominal muscles are  markedly enlarged and edematous. There appears to be sizable hematoma involving the lower left rectus muscle likely accounting for the patient's pain. I do not see any active extravasation of contrast material to suggest active bleed. Some gas is noted in the oblique abdominal muscles bilaterally likely related to the patient's surgery. Musculoskeletal: No significant bony findings. IMPRESSION: 1. Enlarged postpartum uterus with a small blush of contrast in the upper left endometrial area could be a site of active bleeding. Recommend correlation with any vaginal bleeding. 2. Large hematoma involving the left rectus muscle extending into the left oblique abdominal muscles likely accounting for the patient's left lower quadrant pain. 3. Marked splenomegaly. 4. Marked right-sided hydronephrosis and hydroureter down to the pelvis where the ureter is likely compressed by the enlarged uterus. Electronically Signed   By: Rudie Meyer M.D.   On: 06/12/2022 12:27    Procedures .Critical Care  Performed by: Eber Hong, MD Authorized by: Eber Hong, MD   Critical care provider statement:    Critical care time (minutes):  30   Critical care time was exclusive of:  Separately billable procedures and treating other patients and teaching time   Critical care was necessary to treat or prevent imminent or life-threatening deterioration of the following conditions:  Sepsis   Critical care was time spent personally by me on the following activities:  Development of treatment plan with patient or surrogate, discussions with consultants, evaluation of patient's response to treatment, examination of patient, ordering and review of laboratory studies, ordering and review of radiographic studies, ordering and performing treatments and interventions, pulse oximetry, re-evaluation of patient's condition, review of old charts and obtaining history from patient or surrogate   I assumed direction of critical care for this  patient from another provider in my specialty: no     Care discussed with: admitting provider   Comments:           Medications Ordered in ED Medications  furosemide (LASIX) injection 10 mg (has no administration in time range)  labetalol (NORMODYNE) tablet 300 mg (has no administration in time range)  acetaminophen (TYLENOL) tablet 650 mg (has no administration in time range)  lactated ringers infusion (has no  administration in time range)  HYDROmorphone (DILAUDID) injection 1 mg (has no administration in time range)  sodium chloride 0.9 % bolus 1,000 mL (0 mLs Intravenous Stopped 06/12/22 1323)  HYDROmorphone (DILAUDID) injection 1 mg (1 mg Intravenous Given 06/12/22 1041)  iohexol (OMNIPAQUE) 300 MG/ML solution 100 mL (100 mLs Intravenous Contrast Given 06/12/22 1216)    ED Course/ Medical Decision Making/ A&P Clinical Course as of 06/12/22 1324  Thu Jun 12, 2022  1112 I have performed a bedside ultrasound which shows no signs of significant intra-abdominal or intraperitoneal fluid collections.  It is a negative FAST exam. [BM]    Clinical Course User Index [BM] Eber HongMiller, Becky Colan, MD                           Medical Decision Making Amount and/or Complexity of Data Reviewed Labs: ordered. Radiology: ordered. ECG/medicine tests: ordered.  Risk OTC drugs. Prescription drug management. Decision regarding hospitalization.   This patient presents to the ED for concern of abdominal pain postoperative, this involves an extensive number of treatment options, and is a complaint that carries with it a high risk of complications and morbidity.  The differential diagnosis includes worsening bleeding, postoperative infection, postoperative hematoma   Co morbidities that complicate the patient evaluation  Obesity, recently postop   Additional history obtained:  Additional history obtained from medical record External records from outside source obtained and reviewed including  C-section records including documentation of uterine rupture   Lab Tests:  I Ordered, and personally interpreted labs.  The pertinent results include: Urinalysis which is negative for infection, lipase is normal, metabolic panel shows a low albumin which has been consistently low over time, electrolytes and renal function is preserved and liver function is normal.  CBC shows a slight leukocytosis of 13,000 and a hemoglobin of 10.1 which is significantly better than when she left the hospital and her platelets have also increased from 80,000-111,000 which is also a big improvement.   Imaging Studies ordered:  I ordered imaging studies including CT scan of the abdomen and pelvis I independently visualized and interpreted imaging which showed several abnormalities, there is an enlarged postpartum uterus which does appear to have a small amount of active bleeding however given the patient's hemoglobin which is improving this is a very little concern.  It also shows hydronephrosis secondary to compression by the enlarged uterus, the patient is not having any flank pain on that right side.  Most importantly there is a hematoma involving the left rectus muscle which extends into the obliques and the abdominal wall which is probably what is causing the patient's abdominal discomfort I agree with the radiologist interpretation   Cardiac Monitoring: / EKG:  The patient was maintained on a cardiac monitor.  I personally viewed and interpreted the cardiac monitored which showed an underlying rhythm of: Sinus tachycardia rate of 120 bpm on exam   Consultations Obtained:  I requested consultation with the OB/GYN, Dr. Vergie LivingPickens,  and discussed lab and imaging findings as well as pertinent plan - they recommend: Several different interventions.  There is concern as the patient does have a slightly elevated blood pressure today it is 146/97, he recommends starting her on labetalol 300 mg twice a day with the  first dose in the ER.   Also recommends furosemide a small dose in the ER and then a prescription for the next 5 days to help with edema and blood pressure   Problem List /  ED Course / Critical interventions / Medication management  As above the patient has multiple different abnormalities none of which are severe, the blood pressure has been elevated will require treatment in fact the patient will likely need to be admitted for sepsis given the fever and tachycardia I ordered medication including labetalol and furosemide as well as IV fluids for hypertension Reevaluation of the patient after these medicines showed that the patient improved, with regards to her pain however spiked fever and tachycardic consistent with possible sepsis I have reviewed the patients home medicines and have made adjustments as needed   Social Determinants of Health:  Sickle cell disease   Test / Admission - Considered:  Considered admission and after the patient's vital signs were rechecked she was found to be febrile close to 103 degrees with a temperature of 102.9 heart rate of 117 and a source of potential infection being the distended uterus, consider retained products or other sources of infection.  No obvious source found on urinalysis  I have discussed with the patient at the bedside the results, and the meaning of these results.  They have expressed her understanding to the need for follow-up with primary care physician        Final Clinical Impression(s) / ED Diagnoses Final diagnoses:  Rectus sheath hematoma, initial encounter  Hypertension, unspecified type  Gestational edema, antepartum  Sepsis without acute organ dysfunction, due to unspecified organism [facility])    Rx / DC Orders ED Discharge Orders          Ordered    furosemide (LASIX) 20 MG tablet  2 times daily        06/12/22 1312    labetalol (NORMODYNE) 300 MG tablet  2 times daily        06/12/22 1312               Eber Hong, MD 06/12/22 1313    Eber Hong, MD 06/12/22 1324

## 2022-06-12 NOTE — Discharge Instructions (Signed)
There are 2 main abnormalities today that need to be addressed and for which you need to follow-up with your OB/GYN tomorrow at your office appointment.  I did discuss your care with the on-call OB/GYN and they recommended treating her blood pressure with a medication called labetalol, take 1 tablet by mouth twice a day, we have given you the first dose to take your next dose tonight.  I have also given you a dose of the medicine to help you get rid of some of the extra fluid.  Your doctor has recommended taking Lasix twice a day for 5 days to help continue to get rid of some of this extra fluid.  This will make you urinate more frequently  The CT scan shows that you have some bleeding in the abdominal wall after the surgery.  This is fairly common after a surgical procedure like you had, thankfully you do not need any more blood transfusions, the blood is contained exactly where you hurt and it may take several weeks for this to totally go away.  You must follow-up with your doctor tomorrow for blood pressure check in the office and a wound check for your incisional scar  Return to the ER immediately for severe worsening symptoms

## 2022-06-13 ENCOUNTER — Ambulatory Visit: Payer: Medicaid Other

## 2022-06-13 LAB — CBC WITH DIFFERENTIAL/PLATELET
Abs Immature Granulocytes: 0.17 10*3/uL — ABNORMAL HIGH (ref 0.00–0.07)
Basophils Absolute: 0 10*3/uL (ref 0.0–0.1)
Basophils Relative: 0 %
Eosinophils Absolute: 0 10*3/uL (ref 0.0–0.5)
Eosinophils Relative: 0 %
HCT: 23.6 % — ABNORMAL LOW (ref 36.0–46.0)
Hemoglobin: 8.4 g/dL — ABNORMAL LOW (ref 12.0–15.0)
Immature Granulocytes: 1 %
Lymphocytes Relative: 11 %
Lymphs Abs: 1.7 10*3/uL (ref 0.7–4.0)
MCH: 28.5 pg (ref 26.0–34.0)
MCHC: 35.6 g/dL (ref 30.0–36.0)
MCV: 80 fL (ref 80.0–100.0)
Monocytes Absolute: 0.8 10*3/uL (ref 0.1–1.0)
Monocytes Relative: 5 %
Neutro Abs: 13 10*3/uL — ABNORMAL HIGH (ref 1.7–7.7)
Neutrophils Relative %: 83 %
Platelets: 102 10*3/uL — ABNORMAL LOW (ref 150–400)
RBC: 2.95 MIL/uL — ABNORMAL LOW (ref 3.87–5.11)
RDW: 14.5 % (ref 11.5–15.5)
WBC: 15.8 10*3/uL — ABNORMAL HIGH (ref 4.0–10.5)
nRBC: 0 % (ref 0.0–0.2)

## 2022-06-13 LAB — COMPREHENSIVE METABOLIC PANEL
ALT: 13 U/L (ref 0–44)
AST: 16 U/L (ref 15–41)
Albumin: 2.2 g/dL — ABNORMAL LOW (ref 3.5–5.0)
Alkaline Phosphatase: 69 U/L (ref 38–126)
Anion gap: 7 (ref 5–15)
BUN: 11 mg/dL (ref 6–20)
CO2: 23 mmol/L (ref 22–32)
Calcium: 8.3 mg/dL — ABNORMAL LOW (ref 8.9–10.3)
Chloride: 106 mmol/L (ref 98–111)
Creatinine, Ser: 1.19 mg/dL — ABNORMAL HIGH (ref 0.44–1.00)
GFR, Estimated: >60 mL/min (ref >60)
Glucose, Bld: 102 mg/dL — ABNORMAL HIGH (ref 70–99)
Potassium: 3.8 mmol/L (ref 3.5–5.1)
Sodium: 136 mmol/L (ref 135–145)
Total Bilirubin: 1.6 mg/dL — ABNORMAL HIGH (ref 0.3–1.2)
Total Protein: 5.1 g/dL — ABNORMAL LOW (ref 6.5–8.1)

## 2022-06-13 LAB — MAGNESIUM
Magnesium: 1.4 mg/dL — ABNORMAL LOW (ref 1.7–2.4)
Magnesium: 1.5 mg/dL — ABNORMAL LOW (ref 1.7–2.4)

## 2022-06-13 MED ORDER — ENOXAPARIN SODIUM 40 MG/0.4ML IJ SOSY
40.0000 mg | PREFILLED_SYRINGE | INTRAMUSCULAR | Status: DC
  Administered 2022-06-13 – 2022-06-16 (×4): 40 mg via SUBCUTANEOUS
  Filled 2022-06-13 (×4): qty 0.4

## 2022-06-13 MED ORDER — ACETAMINOPHEN 500 MG PO TABS
1000.0000 mg | ORAL_TABLET | Freq: Once | ORAL | Status: AC
Start: 2022-06-13 — End: 2022-06-13
  Administered 2022-06-13: 1000 mg via ORAL
  Filled 2022-06-13: qty 2

## 2022-06-13 MED ORDER — MAGNESIUM SULFATE 2 GM/50ML IV SOLN
2.0000 g | Freq: Once | INTRAVENOUS | Status: AC
  Administered 2022-06-13: 2 g via INTRAVENOUS
  Filled 2022-06-13: qty 50

## 2022-06-13 MED ORDER — LACTATED RINGERS IV SOLN: INTRAVENOUS | Status: DC

## 2022-06-13 NOTE — Progress Notes (Addendum)
Daily Postpartum Note  Admission Date: 06/12/2022 Current Date: 06/13/2022 11:29 AM  Sonya Tapia is a 31 y.o. [id number] readmit (pt discharged on 7/25) POD#5 after 37wk IOL/TOLAC that was c/b urgent LTCS for uterine rupture.  Pregnancy complicated by: Patient Active Problem List   Diagnosis Date Noted   AKI (acute kidney injury) (HCC) 06/12/2022   Rectus sheath hematoma 06/12/2022   Postpartum endometritis 06/12/2022   Thrombocytopenia (HCC) 06/11/2022   Gestational hypertension 06/07/2022   S/P cesarean section 06/07/2022   Uterine rupture 06/07/2022   [place] disease (HCC) 03/20/2022   Alpha thalassemia silent carrier 03/20/2022   History of DVT (deep vein thrombosis) 03/20/2022   Previous cesarean section 02/06/2022   Supervision of high risk pregnancy, antepartum 01/23/2022   Sickle-HgbC anemia of mother during pregnancy (HCC) 01/23/2022   Vitamin D deficiency 07/16/2021   Overnight/24hr events:  Pt admitted early evening yesterday. Recurrent fevers  Subjective:  Patient feels fine. Continuing to meeting PP/postop goals Patient breast feeding and denies any mastitis s/s.   Objective:    Current Vital Signs 24h Vital Sign Ranges  T (!) 101.7 F (38.7 C) Temp  Avg: 101.2 F (38.4 C)  Min: 99.5 F (37.5 C)  Max: 102.9 F (39.4 C)  BP 122/78 BP  Min: 118/70  Max: 155/98  HR (!) 113 Pulse  Avg: 117.8  Min: 110  Max: 132  RR 18 Resp  Avg: 22.6  Min: 16  Max: 33  SaO2 96 % Room Air SpO2  Avg: 98.4 %  Min: 95 %  Max: 100 %       24 Hour I/O Current Shift I/O  Time Ins Outs 07/27 0701 - 07/28 0700 In: 1671.8 [P.O.:480; I.V.:1135.5] Out: 1600 [Urine:1600] No intake/output data recorded.   Patient Vitals for the past 24 hrs:  BP Temp Temp src Pulse Resp SpO2 Height  06/13/22 1126 122/78 (!) 101.7 F (38.7 C) Oral (!) 113 18 96 % --  06/13/22 1125 -- -- -- -- -- 95 % --  06/13/22 0808 124/72 (!) 100.5 F (38.1 C) Oral (!) 116 16 96 % --  06/13/22 0511 128/85 (!) 101.2 F  (38.4 C) Oral (!) 132 16 98 % --  06/12/22 2337 135/78 100.2 F (37.9 C) Oral (!) 116 18 100 % --  06/12/22 2016 -- (!) 102.8 F (39.3 C) Oral -- -- -- --  06/12/22 1928 (!) 146/92 (!) 102.7 F (39.3 C) Oral (!) 115 18 99 % --  06/12/22 1654 128/76 99.5 F (37.5 C) Oral (!) 115 20 99 % 5\' 4"  (1.626 m)  06/12/22 1600 122/79 -- -- (!) 116 (!) 25 99 % --  06/12/22 1459 -- 99.7 F (37.6 C) Oral -- -- -- --  06/12/22 1445 118/70 -- -- (!) 119 (!) 33 99 % --  06/12/22 1415 131/86 -- -- (!) 122 16 98 % --  06/12/22 1345 (!) 155/98 -- -- (!) 110 (!) 33 100 % --  06/12/22 1315 (!) 141/93 (!) 102.9 F (39.4 C) Oral (!) 117 (!) 28 100 % --  06/12/22 1300 (!) 139/93 -- -- (!) 123 (!) 30 100 % --   Physical exam: General: Well nourished, well developed female in no acute distress. Abdomen: +BS, moderate distension, honeycomb dressing which was removed. Steri strips removed b/c most were falling off>>incision c/d/I, no drainage, moderately ttp, normal appearing skin. Pt is moderately ttp all over her abdomen (fundus and non fundal areas); no peritoneal s/s Cardiovascular: S1, S2 normal, no  murmur, rub or gallop, regular rate and rhythm Respiratory: CTAB Extremities: no clubbing, cyanosis or edema Skin: Warm and dry.   Medications: Current Facility-Administered Medications  Medication Dose Route Frequency Provider Last Rate Last Admin   acetaminophen (TYLENOL) tablet 650 mg  650 mg Oral Q6H PRN Luke Bing, MD   650 mg at 06/12/22 1935   enoxaparin (LOVENOX) injection 40 mg  40 mg Subcutaneous Q24H [place] Bing, MD       folic acid (FOLVITE) tablet 1 mg  1 mg Oral Daily [place] Bing, MD   1 mg at 06/13/22 1007   furosemide (LASIX) tablet 20 mg  20 mg Oral BID [place] Bing, MD   20 mg at 06/13/22 9924   gabapentin (NEURONTIN) capsule 200 mg  200 mg Oral BID [place] Bing, MD   200 mg at 06/13/22 1007   HYDROmorphone (DILAUDID) injection 0.2-0.6 mg  0.2-0.6 mg Intravenous Q2H  PRN [place] Bing, MD       labetalol (NORMODYNE) tablet 300 mg  300 mg Oral BID [place] Bing, MD   300 mg at 06/13/22 1007   ondansetron (ZOFRAN) tablet 4 mg  4 mg Oral Q6H PRN [place] Bing, MD       Or   ondansetron (ZOFRAN) injection 4 mg  4 mg Intravenous Q6H PRN [place] Bing, MD       oxyCODONE (Oxy IR/ROXICODONE) immediate release tablet 5-10 mg  5-10 mg Oral Q4H PRN [place] Bing, MD   10 mg at 06/13/22 1013   pantoprazole (PROTONIX) EC tablet 40 mg  40 mg Oral Daily [place] Bing, MD   40 mg at 06/13/22 1007   piperacillin-tazobactam (ZOSYN) IVPB 3.375 g  3.375 g Intravenous Q8H [place] Bing, MD 12.5 mL/hr at 06/13/22 0424 3.375 g at 06/13/22 0424   polyethylene glycol (MIRALAX / GLYCOLAX) packet 17 g  17 g Oral Daily [place] Bing, MD   17 g at 06/13/22 1013   senna (SENOKOT) tablet 8.6 mg  1 tablet Oral QHS PRN [place] Bing, MD       simethicone (MYLICON) chewable tablet 80 mg  80 mg Oral QID PRN  Bing, MD        Labs:  Pending: BCx x 2 Recent Labs  Lab 06/10/22 0825 06/12/22 0950 06/13/22 0424  WBC 7.6 13.1* 15.8*  HGB 7.1* 10.1* 8.4*  HCT 19.6* 28.6* 23.6*  PLT 81* 111* 102*    Recent Labs  Lab 06/07/22 0330 06/12/22 0950 06/13/22 0424  NA 133* 139 136  K 3.7 3.9 3.8  CL 105 108 106  CO2 19* 23 23  BUN <5* 7 11  CREATININE 0.93 0.93 1.19*  CALCIUM 8.8* 8.8* 8.3*  PROT 6.2* 6.0* 5.1*  BILITOT 1.7* 1.0 1.6*  ALKPHOS 124 73 69  ALT 11 12 13   AST 24 24 16   GLUCOSE 70 100* 102*     Radiology:  No new imaging  Assessment & Plan:  Pt stable *PP: routine care *ID: pt hasn't tylenol since yesterday at 1935 and hasn't been on abx for 24h; pt looks clinically well. Will give 1gm now and continue abx. F/u labs.  *Heme: cbc more appropriate as she was 7.1 on 7/25, got 1U PRBCs as a buffer and was discharged home; no post transfusion H/H done. Yesterday's values likely due to hemoconcentration/dehydration. Continue qday  labs. *GHTN: continue with low dose lasix to help with mobilizing third space fluid. Watch Cr. No e/o severe pre-eclampsia. Continue labetalol *PPx: lovenox. Oob ad lit *FEN/GI: SLIV. Pt  encouraged to continue with aggressive PO hydration  Cornelia Copa MD Attending Center for [facility]) GYN Consult Phone: [phone] (M-F, [phone]) & [phone]  (Off hours, weekends, holidays)

## 2022-06-13 NOTE — Lactation Note (Signed)
This note was copied from a baby's chart.  NICU Lactation Consultation Note  Patient Name: Sonya Tapia RUEAV'W Date: 06/13/2022 Age:29 days  Subjective Reason for consult: Follow-up assessment; 1st time breastfeeding; Exclusive pumping and bottle feeding; NICU baby; Early term 37-38.6wks; Other (Comment) (OB Specialty care readmit)  Visited with mom of 29 days old ETI NICU female, she's a P2 but this is her first time breastfeeding. Sonya Tapia got readmitted to [facility] Specialty care yesterday, and she reports she hasn't been pumping consistently, no pumping the last two days. Reviewed Sonya Tapia's goals and she voiced that if breastfeeding doesn't work she's just going to do formula, baby currently on Similac 20 calorie formula, last breastmilk feeding was on 06/12/22. Offered to set up a DEBP but MOB politetly declined, she said she's not sure about pumping at the hospital. She's aware that the lack of pumping might lead to engorgement, breast are still soft upon examination but with some "knots". Offered ice packs to relieve possible pain/discomfort but MOB politely declined; she said she'll think about it. Reviewed engorgement prevention and treatment.  Objective Infant data: Mother's Current Feeding Choice: Breast Milk and Formula  Infant feeding assessment Scale for Readiness: 1 Scale for Quality: 2  Maternal data: G2P2002  C-Section, Low Transverse Pumping frequency: hasn't pumped the last two days, before OB Specialty care reasmission, she was pumping 3 times/24 hours Pumped volume: 0 mL Flange Size: 24 Risk factor for low milk supply:: infrequent/no pumping, OB Specialty care readmission Pump: Personal, DEBP (Motif DEBP at home)  Assessment Infant: In NICU  Maternal: Milk volume: Low  Intervention/Plan Interventions: Breast feeding basics reviewed; Education Tools: Pump; Flanges Pump Education: Setup, frequency, and cleaning; Milk Storage  Plan of care: MOB will  request RN/LC to set her up with a pump if she wants to start pumping while at the hospital She'll also request ice-packs if her breast start to fill in or any pain/discomfort is noted   No other support person at this time. All questions and concerns answered, family to contact [facility] services PRN.  Consult Status: NICU follow-up  NICU Follow-up type: Verify onset of copious milk; Verify absence of engorgement; Weekly NICU follow up    [employer] 06/13/2022, 1:48 PM

## 2022-06-14 DIAGNOSIS — S301XXA Contusion of abdominal wall, initial encounter: Secondary | ICD-10-CM

## 2022-06-14 DIAGNOSIS — R509 Fever, unspecified: Secondary | ICD-10-CM

## 2022-06-14 LAB — CBC WITH DIFFERENTIAL/PLATELET
Abs Immature Granulocytes: 0.33 10*3/uL — ABNORMAL HIGH (ref 0.00–0.07)
Basophils Absolute: 0 10*3/uL (ref 0.0–0.1)
Basophils Relative: 0 %
Eosinophils Absolute: 0.1 10*3/uL (ref 0.0–0.5)
Eosinophils Relative: 0 %
HCT: 22.5 % — ABNORMAL LOW (ref 36.0–46.0)
Hemoglobin: 7.9 g/dL — ABNORMAL LOW (ref 12.0–15.0)
Immature Granulocytes: 2 %
Lymphocytes Relative: 10 %
Lymphs Abs: 1.6 10*3/uL (ref 0.7–4.0)
MCH: 28.1 pg (ref 26.0–34.0)
MCHC: 35.1 g/dL (ref 30.0–36.0)
MCV: 80.1 fL (ref 80.0–100.0)
Monocytes Absolute: 0.8 10*3/uL (ref 0.1–1.0)
Monocytes Relative: 5 %
Neutro Abs: 12.8 10*3/uL — ABNORMAL HIGH (ref 1.7–7.7)
Neutrophils Relative %: 83 %
Platelets: 112 10*3/uL — ABNORMAL LOW (ref 150–400)
RBC: 2.81 MIL/uL — ABNORMAL LOW (ref 3.87–5.11)
RDW: 14.6 % (ref 11.5–15.5)
WBC: 15.6 10*3/uL — ABNORMAL HIGH (ref 4.0–10.5)
nRBC: 0 % (ref 0.0–0.2)

## 2022-06-14 LAB — COMPREHENSIVE METABOLIC PANEL
ALT: 10 U/L (ref 0–44)
AST: 15 U/L (ref 15–41)
Albumin: 2.1 g/dL — ABNORMAL LOW (ref 3.5–5.0)
Alkaline Phosphatase: 76 U/L (ref 38–126)
Anion gap: 6 (ref 5–15)
BUN: 7 mg/dL (ref 6–20)
CO2: 23 mmol/L (ref 22–32)
Calcium: 8.4 mg/dL — ABNORMAL LOW (ref 8.9–10.3)
Chloride: 107 mmol/L (ref 98–111)
Creatinine, Ser: 1.13 mg/dL — ABNORMAL HIGH (ref 0.44–1.00)
GFR, Estimated: >60 mL/min (ref >60)
Glucose, Bld: 99 mg/dL (ref 70–99)
Potassium: 4 mmol/L (ref 3.5–5.1)
Sodium: 136 mmol/L (ref 135–145)
Total Bilirubin: 1.6 mg/dL — ABNORMAL HIGH (ref 0.3–1.2)
Total Protein: 5.2 g/dL — ABNORMAL LOW (ref 6.5–8.1)

## 2022-06-14 MED ORDER — VANCOMYCIN HCL 1250 MG/250ML IV SOLN
1250.0000 mg | INTRAVENOUS | Status: DC
  Administered 2022-06-14 – 2022-06-16 (×3): 1250 mg via INTRAVENOUS
  Filled 2022-06-14 (×3): qty 250

## 2022-06-14 NOTE — Consult Note (Signed)
Pharmacy Antibiotic Note  Sonya Tapia is a 31 y.o. female admitted on 06/12/2022 with  PP endometritis.  Pt has been treated with zosyn for 3 days now and continues to fever. ID consulted and recommended adding vancomycin. SCR 1.13 (trending down), WBC 15.6. Pharmacy has been consulted for vancomycin dosing.  Plan: Vancomycin 1250mg  IV every 24 hours. Estimated AUC 479.2 (Goal AUC 400-600) Contine zosyn 3.375g IV Q8 hours  Monitor renal function, cultures, and clinical improvement Will obtain levels as indicated   Height: 5\' 4"  (162.6 cm) IBW/kg (Calculated) : 54.7  Temp (24hrs), Avg:99.9 F (37.7 C), Min:98.1 F (36.7 C), Max:102.2 F (39 C)  Recent Labs  Lab 06/08/22 1211 06/10/22 0825 06/12/22 0950 06/12/22 1435 06/12/22 1714 06/13/22 0424 06/14/22 0417  WBC 11.6* 7.6 13.1*  --   --  15.8* 15.6*  CREATININE  --   --  0.93  --   --  1.19* 1.13*  LATICACIDVEN  --   --   --  1.3 1.6  --   --     Estimated Creatinine Clearance: 80.9 mL/min (A) (by C-G formula based on SCr of 1.13 mg/dL (H)).    No Known Allergies  Antimicrobials this admission: Zosyn 7/27 >>  Vancomycin 7/29 >>   Dose adjustments this admission:   Microbiology results: 7/27 BCx: NGTD  7/29 UCx:  pending   Thank you for allowing pharmacy to be a part of this patient's care.  8/27, PharmD, BCPPS 06/14/2022 11:22 AM

## 2022-06-14 NOTE — Lactation Note (Signed)
This note was copied from a baby's chart.  NICU Lactation Consultation Note  Patient Name: Boy Marifer Hurd RSWNI'O Date: 06/14/2022 Age:29 days  Subjective Reason for consult: Follow-up assessment; Primapara; 1st time breastfeeding; Exclusive pumping and bottle feeding; Early term 37-38.6wks; Other (Comment); NICU baby (OB Specialty care readmit)  Visited with mom of 6 days old ETI NICU female, she reports that she has decided to focus on formula feeding instead and wishes to discontinue pumping. Reviewed engorgement prevention/treatment and advised to continue icing her breasts as needed and only pump for comfort, she's aware that she's not supposed to fully empty the breasts. No other support person at this time, all questions and concerns answered, family to contact [facility] services PRN.  Objective Infant data: Mother's Current Feeding Choice: Formula Infant feeding assessment Scale for Readiness: 1 Scale for Quality: 3  Maternal data: G2P2002  C-Section, Low Transverse Pumping frequency: hasn't pumped the last two days, before OB Specialty care reasmission, she was pumping 3 times/24 hours Pumped volume: 0 mL Flange Size: 24 Risk factor for low milk supply:: infrequent/no pumping, OB Specialty care readmission Pump: Personal, DEBP (Motif DEBP at home)  Assessment Infant: Feeding Status: Ad lib  Maternal: Milk volume: Low  Intervention/Plan Interventions: Ice; Education  Plan: Consult Status: Complete   Marialena Wollen S Chandler Swiderski 06/14/2022, 11:52 AM

## 2022-06-14 NOTE — Progress Notes (Signed)
Daily Postpartum Note  Admission Date: 06/12/2022 Current Date: 06/14/2022 9:45 AM  Sonya Tapia is a 31 y.o. [id number] readmit (pt discharged on 7/25) POD#6 (readmit) after 37wk IOL/TOLAC that was c/b urgent LTCS for uterine rupture.  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 Continuing to meeting PP/postop goals  Objective:    Current Vital Signs 24h Vital Sign Ranges  T (!) 100.4 F (38 C) Temp  Avg: 99.9 F (37.7 C)  Min: 98.1 F (36.7 C)  Max: 102.2 F (39 C)  BP (!) 141/90 BP  Min: 97/52  Max: 141/90  HR (!) 105 Pulse  Avg: 103.7  Min: 91  Max: 114  RR 16 Resp  Avg: 16.3  Min: 16  Max: 18  SaO2 96 %  (room air) SpO2  Avg: 97 %  Min: 95 %  Max: 100 %       24 Hour I/O Current Shift I/O  Time Ins Outs 07/28 0701 - 07/29 0700 In: 143.1  Out: 2450 [Urine:2450] 07/29 0701 - 07/29 1900 In: -  Out: 700 [Urine:700]   Patient Vitals for the past 24 hrs:  BP Temp Temp src Pulse Resp SpO2  06/14/22 0823 (!) 141/90 (!) 100.4 F (38 C) Oral (!) 105 16 96 %  06/14/22 0342 122/72 98.8 F (37.1 C) Oral 97 16 100 %  06/13/22 2333 (!) 97/52 98.8 F (37.1 C) Oral (!) 102 16 98 %  06/13/22 2037 -- (!) 100.5 F (38.1 C) --  -- -- --  06/13/22 1923 134/86 (!) 102.2 F (39 C) Oral (!) 114 16 97 %  06/13/22 1550 120/72 98.1 F (36.7 C) Oral 91 16 97 %  06/13/22 1230 -- 98.8 F (37.1 C) -- -- -- --  06/13/22 1126 122/78 (!) 101.7 F (38.7 C) Oral (!) 113 18 96 %  06/13/22 1125 -- -- -- -- -- 95 %    Physical exam: General: Well nourished, well developed female in no acute distress. Abdomen: +BS, moderate distension, honeycomb dressing which was removed. incision c/d/I, no drainage, minimally ttp, normal appearing skin.  Cardiovascular: S1, S2 normal, no murmur, rub or gallop, regular rate and rhythm Respiratory: CTAB Extremities: no clubbing, cyanosis or edema Skin: Warm and dry.   Medications: Current Facility-Administered Medications  Medication Dose Route Frequency Provider Last Rate Last Admin   acetaminophen (TYLENOL) tablet 650 mg  650 mg Oral Q6H PRN [place] Bing, MD   650 mg at 06/14/22 0830   enoxaparin (LOVENOX) injection 40 mg  40 mg Subcutaneous Q24H [place] Bing, MD   40 mg at 06/13/22 1934   folic acid (FOLVITE) tablet 1 mg  1 mg Oral Daily [place] Bing, MD   1 mg at 06/13/22 1007   furosemide (LASIX) tablet 20  mg  20 mg Oral BID [place] Bing, MD   20 mg at 06/14/22 0830   gabapentin (NEURONTIN) capsule 200 mg  200 mg Oral BID [place] Bing, MD   200 mg at 06/13/22 2200   HYDROmorphone (DILAUDID) injection 0.2-0.6 mg  0.2-0.6 mg Intravenous Q2H PRN [place] Bing, MD       labetalol (NORMODYNE) tablet 300 mg  300 mg Oral BID [place] Bing, MD   300 mg at 06/13/22 2200   ondansetron (ZOFRAN) tablet 4 mg  4 mg Oral Q6H PRN [place] Bing, MD       Or   ondansetron (ZOFRAN) injection 4 mg  4 mg Intravenous Q6H PRN [place] Bing, MD       oxyCODONE (Oxy IR/ROXICODONE) immediate release tablet 5-10 mg  5-10 mg Oral Q4H PRN [place] Bing, MD   10 mg at 06/13/22 1933   pantoprazole (PROTONIX) EC tablet 40 mg  40 mg Oral Daily [place] Bing, MD   40 mg at 06/13/22  1007   piperacillin-tazobactam (ZOSYN) IVPB 3.375 g  3.375 g Intravenous Q8H [place] Bing, MD 12.5 mL/hr at 06/14/22 0531 3.375 g at 06/14/22 0531   polyethylene glycol (MIRALAX / GLYCOLAX) packet 17 g  17 g Oral Daily [place] Bing, MD   17 g at 06/13/22 1013   senna (SENOKOT) tablet 8.6 mg  1 tablet Oral QHS PRN [place] Bing, MD       simethicone (MYLICON) chewable tablet 80 mg  80 mg Oral QID PRN  Bing, MD   80 mg at 06/14/22 0830    Labs:  Pending: BCx x 2 (NGTD)  Recent Labs  Lab 06/12/22 0950 06/13/22 0424 06/14/22 0417  WBC 13.1* 15.8* 15.6*  HGB 10.1* 8.4* 7.9*  HCT 28.6* 23.6* 22.5*  PLT 111* 102* 112*     Recent Labs  Lab 06/12/22 0950 06/13/22 0424 06/14/22 0417  NA 139 136 136  K 3.9 3.8 4.0  CL 108 106 107  CO2 23 23 23   BUN 7 11 7   CREATININE 0.93 1.19* 1.13*  CALCIUM 8.8* 8.3* 8.4*  PROT 6.0* 5.1* 5.2*  BILITOT 1.0 1.6* 1.6*  ALKPHOS 73 69 76  ALT 12 13 10   AST 24 16 15   GLUCOSE 100* 102* 99    Radiology:  No new imaging  Assessment & Plan:  Pt stable *PP: routine care *ID: will send Ucx and consult ID since having recurrent temps on zosyn D#3, although clinically pt is improving. Prior neg cxr, covid swab. Follow up in process BCx.  *Renal: Cr stable; follow qday *Heme: continue qday CBCs *GHTN: continue with low dose lasix to help with mobilizing third space fluid. Watch Cr. No e/o severe pre-eclampsia. Continue labetalol *PPx: lovenox. Oob ad lib *FEN/GI: SLIV. Aggressive PO hydration  MD Attending Center for [facility]) GYN Consult Phone: [phone] (M-F, [phone]) & [phone]  (Off hours, weekends, holidays)

## 2022-06-14 NOTE — Consult Note (Signed)
Date of Admission:  06/12/2022          Reason for Consult: Continuing fevers with patient the ruptured uterus and endometritis despite appropriate antibiotics   Referring Provider: Emelda Fear, MD   Assessment:  Ruptured uterus requiring C-section now admission for endometritis with patient favoring through Zosyn Rectus sheath hematoma History of sickle cell disease  Plan:  We will add vancomycin to her Zosyn in case MRSA is somehow involved in her infection She fails to improve would repeat CT scan in a few days to look for intra-abdominal abscess that might need to be drained   Principal Problem:   Postpartum endometritis Active Problems:   Rectus sheath hematoma   Scheduled Meds:  enoxaparin (LOVENOX) injection  40 mg Subcutaneous Q24H   folic acid  1 mg Oral Daily   furosemide  20 mg Oral BID   gabapentin  200 mg Oral BID   labetalol  300 mg Oral BID   pantoprazole  40 mg Oral Daily   polyethylene glycol  17 g Oral Daily   Continuous Infusions:  piperacillin-tazobactam (ZOSYN)  IV 3.375 g (06/14/22 0531)   vancomycin 1,250 mg (06/14/22 1212)   PRN Meds:.acetaminophen, HYDROmorphone (DILAUDID) injection, ondansetron **OR** ondansetron (ZOFRAN) IV, oxyCODONE, senna, simethicone  HPI: Terrace Chiem is a 31 y.o. female history of sickle cell disease, estrogen induced DVT, thrombocytopenia [id number] POD#4 s/p emergency c-section for uterine rupture after IOL GHTN at 37wks who was readmitted with fevers and abdominal pain.  CT of the abdomen pelvis was performed which showed a large postpartum uterus with small blush of contrast in the left endometrial area and a large hematoma involving the left rectus sheath muscle muscle extending to the left oblique abdominal muscles splenomegaly and right-sided hydronephrosis and hydroureter.  She been placed on Zosyn but continues to have quite high fevers despite being on appropriately broad-spectrum antibiotics for  intra-abdominal infection.  I am going to add vancomycin in case MRSA is somehow involved in the endometritis.  If she fails to improve in the next few days would recommend repeat CT abdomen pelvis with contrast.  Certainly sickle cell disease can cause fevers but she has [place] no evidence of an acute sickle cell crisis whatsoever.  I spent 83 minutes with the patient including than 50% of the time in face to face counseling of the patient and her fevers her endometritis, personally reviewing her CT abdomen pelvis along with review of medical records in preparation for the visit and during the visit and in coordination of her care.      Review of Systems: Review of Systems  Constitutional:  Positive for fever. Negative for chills, malaise/fatigue and weight loss.  HENT:  Negative for congestion and sore throat.   Eyes:  Negative for blurred vision and photophobia.  Respiratory:  Negative for cough, shortness of breath and wheezing.   Cardiovascular:  Negative for chest pain, palpitations and leg swelling.  Gastrointestinal:  Positive for abdominal pain. Negative for blood in stool, constipation, diarrhea, heartburn, melena, nausea and vomiting.  Genitourinary:  Negative for dysuria, flank pain and hematuria.  Musculoskeletal:  Negative for back pain, falls, joint pain and myalgias.  Skin:  Negative for itching and rash.  Neurological:  Negative for dizziness, focal weakness, loss of consciousness, weakness and headaches.  Endo/Heme/Allergies:  Does not bruise/bleed easily.  Psychiatric/Behavioral:  Negative for depression and suicidal ideas. The patient does not have insomnia.    Her surgical site is clean and without  evidence of purulence  Past Medical History:  Diagnosis Date   DVT (deep venous thrombosis) (HCC)    Pregnancy induced hypertension    Sickle cell anemia (HCC)     Social History   Tobacco Use   Smoking status: Never   Smokeless tobacco: Never  Vaping Use    Vaping Use: Never used  Substance Use Topics   Alcohol use: Not Currently    Comment: not while preg   Drug use: Never    Family History  Problem Relation Age of Onset   Hypertension Mother    Healthy Father    No Known Allergies  OBJECTIVE: Blood pressure 128/88, pulse 86, temperature 98.1 F (36.7 C), temperature source Oral, resp. rate 16, height 5\' 4"  (1.626 m), last menstrual period 09/17/2021, SpO2 95 %, unknown if currently breastfeeding.  Physical Exam Constitutional:      General: She is not in acute distress.    Appearance: She is not diaphoretic.  HENT:     Head: Normocephalic and atraumatic.     Right Ear: External ear normal.     Left Ear: External ear normal.     Nose: Nose normal.     Mouth/Throat:     Pharynx: No oropharyngeal exudate.  Eyes:     General: No scleral icterus.       Right eye: No discharge.        Left eye: No discharge.     Extraocular Movements: Extraocular movements intact.     Conjunctiva/sclera: Conjunctivae normal.  Cardiovascular:     Rate and Rhythm: Normal rate and regular rhythm.     Heart sounds:     No friction rub.  Pulmonary:     Effort: Pulmonary effort is normal. No respiratory distress.     Breath sounds: No wheezing or rales.  Abdominal:     General: There is no distension.     Palpations: Abdomen is soft.     Tenderness: There is no rebound.  Musculoskeletal:        General: No tenderness. Normal range of motion.     Cervical back: Normal range of motion and neck supple.  Lymphadenopathy:     Cervical: No cervical adenopathy.  Skin:    General: Skin is warm and dry.     Coloration: Skin is not jaundiced or pale.     Findings: No erythema, lesion or rash.  Neurological:     General: No focal deficit present.     Mental Status: She is alert and oriented to person, place, and time.     Coordination: Coordination normal.  Psychiatric:        Mood and Affect: Mood normal.        Behavior: Behavior normal.         Thought Content: Thought content normal.        Judgment: Judgment normal.     Lab Results Lab Results  Component Value Date   WBC 15.6 (H) 06/14/2022   HGB 7.9 (L) 06/14/2022   HCT 22.5 (L) 06/14/2022   MCV 80.1 06/14/2022   PLT 112 (L) 06/14/2022    Lab Results  Component Value Date   CREATININE 1.13 (H) 06/14/2022   BUN 7 06/14/2022   NA 136 06/14/2022   K 4.0 06/14/2022   CL 107 06/14/2022   CO2 23 06/14/2022    Lab Results  Component Value Date   ALT 10 06/14/2022   AST 15 06/14/2022   ALKPHOS 76 06/14/2022   BILITOT  1.6 (H) 06/14/2022     Microbiology: Recent Results (from the past 240 hour(s))  Culture, beta strep (group b only)     Status: None   Collection Time: 06/04/22  2:02 PM   Specimen: Vaginal/Rectal; Genital   VR  Result Value Ref Range Status   Strep Gp B Culture Negative Negative Final    Comment: Centers for Disease Control and Prevention (CDC) and American Congress of Obstetricians and Gynecologists (ACOG) guidelines for prevention of perinatal group B streptococcal (GBS) disease specify co-collection of a vaginal and rectal swab specimen to maximize sensitivity of GBS detection. Per the CDC and ACOG, swabbing both the lower vagina and rectum substantially increases the yield of detection compared with sampling the vagina alone. Penicillin G, ampicillin, or cefazolin are indicated for intrapartum prophylaxis of perinatal GBS colonization. Reflex susceptibility testing should be performed prior to use of clindamycin only on GBS isolates from penicillin-allergic women who are considered a high risk for anaphylaxis. Treatment with vancomycin without additional testing is warranted if resistance to clindamycin is noted.   Resp Panel by RT-PCR (Flu A&B, Covid) Anterior Nasal Swab     Status: None   Collection Time: 06/12/22  2:05 PM   Specimen: Anterior Nasal Swab  Result Value Ref Range Status   SARS Coronavirus 2 by RT PCR NEGATIVE NEGATIVE  Final    Comment: (NOTE) SARS-CoV-2 target nucleic acids are NOT DETECTED.  The SARS-CoV-2 RNA is generally detectable in upper respiratory specimens during the acute phase of infection. The lowest concentration of SARS-CoV-2 viral copies this assay can detect is 138 copies/mL. A negative result does not preclude SARS-Cov-2 infection and should not be used as the sole basis for treatment or other patient management decisions. A negative result may occur with  improper specimen collection/handling, submission of specimen other than nasopharyngeal swab, presence of viral mutation(s) within the areas targeted by this assay, and inadequate number of viral copies(<138 copies/mL). A negative result must be combined with clinical observations, patient history, and epidemiological information. The expected result is Negative.  Fact Sheet for Patients:  BloggerCourse.com  Fact Sheet for Healthcare Providers:  SeriousBroker.it  This test is no t yet approved or cleared by the [place] FDA and  has been authorized for detection and/or diagnosis of SARS-CoV-2 by FDA under an Emergency Use Authorization (EUA). This EUA will remain  in effect (meaning this test can be used) for the duration of the COVID-19 declaration under Section 564(b)(1) of the Act, 21 U.S.C.section 360bbb-3(b)(1), unless the authorization is terminated  or revoked sooner.       Influenza A by PCR NEGATIVE NEGATIVE Final   Influenza B by PCR NEGATIVE NEGATIVE Final    Comment: (NOTE) The Xpert Xpress SARS-CoV-2/FLU/RSV plus assay is intended as an aid in the diagnosis of influenza from Nasopharyngeal swab specimens and should not be used as a sole basis for treatment. Nasal washings and aspirates are unacceptable for Xpert Xpress SARS-CoV-2/FLU/RSV testing.  Fact Sheet for Patients: BloggerCourse.com  Fact Sheet for Healthcare  Providers: SeriousBroker.it  This test is not yet approved or cleared by the [place] FDA and has been authorized for detection and/or diagnosis of SARS-CoV-2 by FDA under an Emergency Use Authorization (EUA). This EUA will remain in effect (meaning this test can be used) for the duration of the COVID-19 declaration under Section 564(b)(1) of the Act, 21 U.S.C. section 360bbb-3(b)(1), unless the authorization is terminated or revoked.  Performed at [facility], [address] [place], [place] [address]   Blood Culture (routine x 2)     Status: None (Preliminary result)   Collection Time: 06/12/22  2:35 PM   Specimen: BLOOD  Result Value Ref Range Status   Specimen Description BLOOD BLOOD RIGHT FOREARM  Final   Special Requests   Final    BOTTLES DRAWN AEROBIC AND ANAEROBIC Blood Culture adequate volume   Culture   Final    NO GROWTH 2 DAYS Performed at [facility], [address], [place], [place] [address]    Report Status PENDING  Incomplete  Blood Culture (routine x 2)     Status: None (Preliminary result)   Collection Time: 06/12/22  5:13 PM   Specimen: BLOOD  Result Value Ref Range Status   Specimen Description BLOOD LEFT ANTECUBITAL  Final   Special Requests   Final    BOTTLES DRAWN AEROBIC AND ANAEROBIC Blood Culture adequate volume   Culture   Final    NO GROWTH 2 DAYS Performed at [facility], [address] [place], [place] [address]    Report Status PENDING  Incomplete    Acey Lav, MD [facility] for Infectious Disease [facility] [phone] pager  06/14/2022, 12:30 PM

## 2022-06-15 DIAGNOSIS — A419 Sepsis, unspecified organism: Secondary | ICD-10-CM

## 2022-06-15 DIAGNOSIS — Q6211 Congenital occlusion of ureteropelvic junction: Secondary | ICD-10-CM

## 2022-06-15 DIAGNOSIS — R509 Fever, unspecified: Secondary | ICD-10-CM

## 2022-06-15 LAB — COMPREHENSIVE METABOLIC PANEL
ALT: 9 U/L (ref 0–44)
AST: 12 U/L — ABNORMAL LOW (ref 15–41)
Albumin: 2.1 g/dL — ABNORMAL LOW (ref 3.5–5.0)
Alkaline Phosphatase: 74 U/L (ref 38–126)
Anion gap: 8 (ref 5–15)
BUN: 7 mg/dL (ref 6–20)
CO2: 22 mmol/L (ref 22–32)
Calcium: 8.1 mg/dL — ABNORMAL LOW (ref 8.9–10.3)
Chloride: 104 mmol/L (ref 98–111)
Creatinine, Ser: 1.16 mg/dL — ABNORMAL HIGH (ref 0.44–1.00)
GFR, Estimated: >60 mL/min (ref >60)
Glucose, Bld: 91 mg/dL (ref 70–99)
Potassium: 3.5 mmol/L (ref 3.5–5.1)
Sodium: 134 mmol/L — ABNORMAL LOW (ref 135–145)
Total Bilirubin: 1 mg/dL (ref 0.3–1.2)
Total Protein: 5.4 g/dL — ABNORMAL LOW (ref 6.5–8.1)

## 2022-06-15 LAB — CBC WITH DIFFERENTIAL/PLATELET
Abs Immature Granulocytes: 0.08 10*3/uL — ABNORMAL HIGH (ref 0.00–0.07)
Basophils Absolute: 0 10*3/uL (ref 0.0–0.1)
Basophils Relative: 0 %
Eosinophils Absolute: 0.1 10*3/uL (ref 0.0–0.5)
Eosinophils Relative: 1 %
HCT: 21.4 % — ABNORMAL LOW (ref 36.0–46.0)
Hemoglobin: 7.7 g/dL — ABNORMAL LOW (ref 12.0–15.0)
Immature Granulocytes: 1 %
Lymphocytes Relative: 13 %
Lymphs Abs: 1.5 10*3/uL (ref 0.7–4.0)
MCH: 28 pg (ref 26.0–34.0)
MCHC: 36 g/dL (ref 30.0–36.0)
MCV: 77.8 fL — ABNORMAL LOW (ref 80.0–100.0)
Monocytes Absolute: 0.8 10*3/uL (ref 0.1–1.0)
Monocytes Relative: 7 %
Neutro Abs: 8.9 10*3/uL — ABNORMAL HIGH (ref 1.7–7.7)
Neutrophils Relative %: 78 %
Platelets: 116 10*3/uL — ABNORMAL LOW (ref 150–400)
RBC: 2.75 MIL/uL — ABNORMAL LOW (ref 3.87–5.11)
RDW: 15.7 % — ABNORMAL HIGH (ref 11.5–15.5)
WBC: 11.3 10*3/uL — ABNORMAL HIGH (ref 4.0–10.5)
nRBC: 0 % (ref 0.0–0.2)

## 2022-06-15 LAB — CULTURE, OB URINE: Culture: NO GROWTH

## 2022-06-15 LAB — HIV ANTIBODY (ROUTINE TESTING W REFLEX): HIV Screen 4th Generation wRfx: NONREACTIVE

## 2022-06-15 NOTE — Progress Notes (Signed)
Subjective:   She still has some pain where her rectus hematoma is.   Antibiotics:  Anti-infectives (From admission, onward)    Start     Dose/Rate Route Frequency Ordered Stop   06/14/22 1200  vancomycin (VANCOREADY) IVPB 1250 mg/250 mL        1,250 mg 166.7 mL/hr over 90 Minutes Intravenous Every 24 hours 06/14/22 1111     06/12/22 2100  piperacillin-tazobactam (ZOSYN) IVPB 3.375 g       See Hyperspace for full Linked Orders Report.   3.375 g 12.5 mL/hr over 240 Minutes Intravenous Every 8 hours 06/12/22 1332     06/12/22 1345  piperacillin-tazobactam (ZOSYN) IVPB 3.375 g       See Hyperspace for full Linked Orders Report.   3.375 g 100 mL/hr over 30 Minutes Intravenous  Once 06/12/22 1332 06/12/22 1523   06/12/22 1330  cefTRIAXone (ROCEPHIN) 2 g in sodium chloride 0.9 % 100 mL IVPB  Status:  Discontinued        2 g 200 mL/hr over 30 Minutes Intravenous  Once 06/12/22 1320 06/12/22 1322   06/12/22 1330  metroNIDAZOLE (FLAGYL) IVPB 500 mg  Status:  Discontinued        500 mg 100 mL/hr over 60 Minutes Intravenous  Once 06/12/22 1320 06/12/22 1322       Medications: Scheduled Meds:  enoxaparin (LOVENOX) injection  40 mg Subcutaneous Q24H   folic acid  1 mg Oral Daily   furosemide  20 mg Oral BID   gabapentin  200 mg Oral BID   labetalol  300 mg Oral BID   pantoprazole  40 mg Oral Daily   polyethylene glycol  17 g Oral Daily   Continuous Infusions:  piperacillin-tazobactam (ZOSYN)  IV Stopped (06/15/22 1020)   vancomycin 1,250 mg (06/15/22 1150)   PRN Meds:.acetaminophen, HYDROmorphone (DILAUDID) injection, ondansetron **OR** ondansetron (ZOFRAN) IV, oxyCODONE, senna, simethicone    Objective: Weight change:   Intake/Output Summary (Last 24 hours) at 06/15/2022 1208 Last data filed at 06/15/2022 0827 Gross per 24 hour  Intake 330.21 ml  Output 1500 ml  Net -1169.79 ml   Blood pressure 123/79, pulse 85, temperature 98.4 F (36.9 C), temperature  source Oral, resp. rate 18, height 5\' 4"  (1.626 m), last menstrual period 09/17/2021, SpO2 98 %, unknown if currently breastfeeding. Temp:  [98.4 F (36.9 C)-100 F (37.8 C)] 98.4 F (36.9 C) (07/30 1147) Pulse Rate:  [81-118] 85 (07/30 1147) Resp:  [16-19] 18 (07/30 1147) BP: (119-139)/(64-88) 123/79 (07/30 1147) SpO2:  [98 %-100 %] 98 % (07/30 1147)  Physical Exam: Physical Exam Constitutional:      General: She is not in acute distress.    Appearance: She is well-developed. She is not diaphoretic.  HENT:     Head: Normocephalic and atraumatic.     Right Ear: External ear normal.     Left Ear: External ear normal.     Mouth/Throat:     Pharynx: No oropharyngeal exudate.  Eyes:     General: No scleral icterus.    Conjunctiva/sclera: Conjunctivae normal.     Pupils: Pupils are equal, round, and reactive to light.  Cardiovascular:     Rate and Rhythm: Normal rate and regular rhythm.  Pulmonary:     Effort: Pulmonary effort is normal. No respiratory distress.     Breath sounds: No wheezing or rales.  Abdominal:     General: There is no distension.     Palpations: Abdomen  is soft.  Musculoskeletal:        General: No tenderness. Normal range of motion.  Lymphadenopathy:     Cervical: No cervical adenopathy.  Skin:    General: Skin is warm and dry.     Coloration: Skin is not pale.     Findings: No erythema or rash.  Neurological:     General: No focal deficit present.     Mental Status: She is alert and oriented to person, place, and time.     Motor: No abnormal muscle tone.     Coordination: Coordination normal.  Psychiatric:        Mood and Affect: Mood normal.        Behavior: Behavior normal.        Thought Content: Thought content normal.        Judgment: Judgment normal.     Incision is clean CBC:    BMET Recent Labs    06/14/22 0417 06/15/22 0404  NA 136 134*  K 4.0 3.5  CL 107 104  CO2 23 22  GLUCOSE 99 91  BUN 7 7  CREATININE 1.13* 1.16*   CALCIUM 8.4* 8.1*     Liver Panel  Recent Labs    06/14/22 0417 06/15/22 0404  PROT 5.2* 5.4*  ALBUMIN 2.1* 2.1*  AST 15 12*  ALT 10 9  ALKPHOS 76 74  BILITOT 1.6* 1.0       Sedimentation Rate No results for input(s): "ESRSEDRATE" in the last 72 hours. C-Reactive Protein No results for input(s): "CRP" in the last 72 hours.  Micro Results: Recent Results (from the past 720 hour(s))  Culture, beta strep (group b only)     Status: None   Collection Time: 06/04/22  2:02 PM   Specimen: Vaginal/Rectal; Genital   VR  Result Value Ref Range Status   Strep Gp B Culture Negative Negative Final    Comment: Centers for Disease Control and Prevention (CDC) and American Congress of Obstetricians and Gynecologists (ACOG) guidelines for prevention of perinatal group B streptococcal (GBS) disease specify co-collection of a vaginal and rectal swab specimen to maximize sensitivity of GBS detection. Per the CDC and ACOG, swabbing both the lower vagina and rectum substantially increases the yield of detection compared with sampling the vagina alone. Penicillin G, ampicillin, or cefazolin are indicated for intrapartum prophylaxis of perinatal GBS colonization. Reflex susceptibility testing should be performed prior to use of clindamycin only on GBS isolates from penicillin-allergic women who are considered a high risk for anaphylaxis. Treatment with vancomycin without additional testing is warranted if resistance to clindamycin is noted.   Resp Panel by RT-PCR (Flu A&B, Covid) Anterior Nasal Swab     Status: None   Collection Time: 06/12/22  2:05 PM   Specimen: Anterior Nasal Swab  Result Value Ref Range Status   SARS Coronavirus 2 by RT PCR NEGATIVE NEGATIVE Final    Comment: (NOTE) SARS-CoV-2 target nucleic acids are NOT DETECTED.  The SARS-CoV-2 RNA is generally detectable in upper respiratory specimens during the acute phase of infection. The lowest concentration of  SARS-CoV-2 viral copies this assay can detect is 138 copies/mL. A negative result does not preclude SARS-Cov-2 infection and should not be used as the sole basis for treatment or other patient management decisions. A negative result may occur with  improper specimen collection/handling, submission of specimen other than nasopharyngeal swab, presence of viral mutation(s) within the areas targeted by this assay, and inadequate number of viral copies(<138 copies/mL). A negative  result must be combined with clinical observations, patient history, and epidemiological information. The expected result is Negative.  Fact Sheet for Patients:  BloggerCourse.com  Fact Sheet for Healthcare Providers:  SeriousBroker.it  This test is no t yet approved or cleared by the [place] FDA and  has been authorized for detection and/or diagnosis of SARS-CoV-2 by FDA under an Emergency Use Authorization (EUA). This EUA will remain  in effect (meaning this test can be used) for the duration of the COVID-19 declaration under Section 564(b)(1) of the Act, 21 U.S.C.section 360bbb-3(b)(1), unless the authorization is terminated  or revoked sooner.       Influenza A by PCR NEGATIVE NEGATIVE Final   Influenza B by PCR NEGATIVE NEGATIVE Final    Comment: (NOTE) The Xpert Xpress SARS-CoV-2/FLU/RSV plus assay is intended as an aid in the diagnosis of influenza from Nasopharyngeal swab specimens and should not be used as a sole basis for treatment. Nasal washings and aspirates are unacceptable for Xpert Xpress SARS-CoV-2/FLU/RSV testing.  Fact Sheet for Patients: BloggerCourse.com  Fact Sheet for Healthcare Providers: SeriousBroker.it  This test is not yet approved or cleared by the [place] FDA and has been authorized for detection and/or diagnosis of SARS-CoV-2 by FDA under an Emergency Use  Authorization (EUA). This EUA will remain in effect (meaning this test can be used) for the duration of the COVID-19 declaration under Section 564(b)(1) of the Act, 21 U.S.C. section 360bbb-3(b)(1), unless the authorization is terminated or revoked.  Performed at [facility], [address], [place], [place] [address]   Blood Culture (routine x 2)     Status: None (Preliminary result)   Collection Time: 06/12/22  2:35 PM   Specimen: BLOOD  Result Value Ref Range Status   Specimen Description BLOOD BLOOD RIGHT FOREARM  Final   Special Requests   Final    BOTTLES DRAWN AEROBIC AND ANAEROBIC Blood Culture adequate volume   Culture   Final    NO GROWTH 3 DAYS Performed at [facility], [address] [place], [place] [address]    Report Status PENDING  Incomplete  Blood Culture (routine x 2)     Status: None (Preliminary result)   Collection Time: 06/12/22  5:13 PM   Specimen: BLOOD  Result Value Ref Range Status   Specimen Description BLOOD LEFT ANTECUBITAL  Final   Special Requests   Final    BOTTLES DRAWN AEROBIC AND ANAEROBIC Blood Culture adequate volume   Culture   Final    NO GROWTH 3 DAYS Performed at [facility], [address] [place], [place] [address]    Report Status PENDING  Incomplete  Culture, OB Urine     Status: None   Collection Time: 06/14/22  9:50 AM   Specimen: Urine, Random  Result Value Ref Range Status   Specimen Description URINE, RANDOM  Final   Special Requests NONE  Final   Culture   Final    NO GROWTH Performed at [facility], [address], [place], [place] [address]    Report Status 06/15/2022 FINAL  Final    Studies/Results: No results found.    Assessment/Plan:  INTERVAL HISTORY: Fever curve seems improved after vancomycin initiation   Principal Problem:   Postpartum endometritis Active Problems:   Rectus sheath hematoma    Sonya Tapia is a 31 y.o. female with  history of sickle cell  disease, estrogen induced DVT, thrombocytopenia [id number] POD#4 s/p emergency c-section for  uterine rupture after IOL GHTN at 37wks who was readmitted with fevers and abdominal pain.  CT of the abdomen pelvis was performed which showed a large postpartum uterus with small blush of contrast in the left endometrial area and a large hematoma involving the left rectus sheath muscle muscle extending to the left oblique abdominal muscles splenomegaly and right-sided hydronephrosis and hydroureter.  She had been placed on Zosyn but was still having high fevers despite appropriate broad-spectrum antibiotics.  We added vancomycin yesterday and fever curve seems improved  #1 Endometritis:  Would continue current zosyn and vancomycin for time being and hopefully she will continue to improve we could switch to oral therapy.  Sounds as if she may not be going to better breast-feed due to the problems of uterine contractions that is caused her in the context of her current pain.  If she does breast-feed we will need to keep that in consideration with choice of antibiotics  #2 acute renal insufficiency in the context of hydronephrosis.  She is on vancomycin and Zosyn so certainly if there is greater concern about renal function we could move away from vancomycin to something such as daptomycin + zosyn as far as systemic antibiotics while she is here  New team is on tomorrow       LOS: 3 days   Acey Lav 06/15/2022, 12:08 PM

## 2022-06-15 NOTE — Progress Notes (Signed)
Circumcision Consent   Discussed with mom at bedside about circumcision.    Circumcision is a surgery that removes the skin that covers the tip of the penis, called the "foreskin." Circumcision is usually done when a boy is between 44 and 60 days old, sometimes up to 24-74 weeks old.   The most common reasons boys are circumcised include for cultural/religious beliefs or for parental preference (potentially easier to clean, so baby looks like daddy, etc).   There may be some medical benefits for circumcision:    Circumcised boys seem to have slightly lower rates of: ? Urinary tract infections (per the American Academy of Pediatrics an uncircumcised boy has a 1/100 chance of developing a UTI in the first year of life, a circumcised boy at a 11/998 chance of developing a UTI in the first year of life- a 10% reduction) ? Penis cancer (typically rare- an uncircumcised female has a 1 in 100,000 chance of developing cancer of the penis) ? Sexually transmitted infection (in endemic areas, including HIV, HPV and Herpes- circumcision does NOT protect against gonorrhea, chlamydia, trachomatis, or syphilis) ? Phimosis: a condition where that makes retraction of the foreskin over the glans impossible (0.4 per 1000 boys per year or 0.6% of boys are affected by their 15th birthday)   Boys and men who are not circumcised can reduce these extra risks by: ? Cleaning their penis well ? Using condoms during sex   What are the risks of circumcision?   As with any surgical procedure, there are risks and complications. In circumcision, complications are rare and usually minor, the most common being: ? Bleeding- risk is reduced by holding each clamp for 30 seconds prior to a cut being made, and by holding pressure after the procedure is done ? Infection- the penis is cleaned prior to the procedure, and the procedure is done under sterile technique ? Damage to the urethra or amputation of the penis   All questions  were answered and mother consented.  Circumcision Consent   Discussed with mom at bedside about circumcision.    Circumcision is a surgery that removes the skin that covers the tip of the penis, called the "foreskin." Circumcision is usually done when a boy is between 38 and 69 days old, sometimes up to 73-85 weeks old.   The most common reasons boys are circumcised include for cultural/religious beliefs or for parental preference (potentially easier to clean, so baby looks like daddy, etc).   There may be some medical benefits for circumcision:    Circumcised boys seem to have slightly lower rates of: ? Urinary tract infections (per the American Academy of Pediatrics an uncircumcised boy has a 1/100 chance of developing a UTI in the first year of life, a circumcised boy at a 11/998 chance of developing a UTI in the first year of life- a 10% reduction) ? Penis cancer (typically rare- an uncircumcised female has a 1 in 100,000 chance of developing cancer of the penis) ? Sexually transmitted infection (in endemic areas, including HIV, HPV and Herpes- circumcision does NOT protect against gonorrhea, chlamydia, trachomatis, or syphilis) ? Phimosis: a condition where that makes retraction of the foreskin over the glans impossible (0.4 per 1000 boys per year or 0.6% of boys are affected by their 15th birthday)   Boys and men who are not circumcised can reduce these extra risks by: ? Cleaning their penis well ? Using condoms during sex   What are the risks of circumcision?  As with any surgical procedure, there are risks and complications. In circumcision, complications are rare and usually minor, the most common being: ? Bleeding- risk is reduced by holding each clamp for 30 seconds prior to a cut being made, and by holding pressure after the procedure is done ? Infection- the penis is cleaned prior to the procedure, and the procedure is done under sterile technique ? Damage to the urethra or  amputation of the penis   How is circumcision done in baby boys?   The baby will be placed on a special table and the legs restrained for their safety. Numbing medication is injected into the penis, and the skin is cleansed with betadine to decrease the risk of infection.    What to expect:   The penis will look red and raw for 5-7 days as it heals. We expect scabbing around where the cut was made, as well as clear-pink fluid and some swelling of the penis right after the procedure. If your baby's circumcision starts to bleed or develops pus, please contact your pediatrician immediately.   All questions were answered and mother consented.   Please call "first call labor and delivery" on vocera when baby is ready for circ  Cornelia Copa MD Attending Center for [facility] (Faculty Practice) GYN Consult Phone: [phone] (M-F, [phone]) & [phone] (Off hours, weekends, holidays)

## 2022-06-15 NOTE — Progress Notes (Signed)
Daily Postpartum Note  Admission Date: 06/12/2022 Current Date: 06/15/2022 11:26 AM  Sonya Tapia is a 31 y.o. [id number] readmit (pt discharged on 7/25) POD#7 (readmit) after 37wk IOL/TOLAC that was c/b urgent LTCS for uterine rupture.  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) 01/23/2022   Vitamin D deficiency 07/16/2021   Overnight/24hr events:  Seen by ID yesterday>vanc added  Subjective:  S/s stable  Objective:    Current Vital Signs 24h Vital Sign Ranges  T 99.2 F (37.3 C) Temp  Avg: 99.1 F (37.3 C)  Min: 98.5 F (36.9 C)  Max: 100 F (37.8 C)  BP 130/81 BP  Min: 119/64  Max: 139/87  HR 88 Pulse  Avg: 93.4  Min: 81  Max: 118  RR 18 Resp  Avg: 18  Min: 16  Max: 19  SaO2 98 % Room Air SpO2  Avg: 99 %  Min: 98 %  Max: 100 %       24 Hour I/O Current Shift I/O  Time Ins Outs 07/29 0701 - 07/30 0700 In: 330.2  Out: 1300 [Urine:1300] 07/30 0701 - 07/30 1900 In: -  Out: 900 [Urine:900]   Patient Vitals for the past 24 hrs:  BP Temp Temp src Pulse Resp SpO2  06/15/22 0823 130/81 99.2 F (37.3 C) Oral 88 18 98 %  06/15/22 0446 139/87 99.3 F (37.4 C) Oral 87 19 100 %  06/15/22 0055 119/64 98.5 F (36.9 C) Oral 93 19 99 %  06/14/22 2035 119/78 100 F (37.8 C) Oral (!) 118 18 99 %  06/14/22 1536 129/88 98.6 F (37 C) Oral 81 16 99 %    Physical exam: General: Well nourished, well developed  female in no acute distress. Abdomen: +BS, mild to moderate distension, incision c/d/I, no drainage, nttp, normal appearing skin.  Cardiovascular: S1, S2 normal, no murmur, rub or gallop, regular rate and rhythm Respiratory: CTAB Extremities: no clubbing, cyanosis or edema Skin: Warm and dry.   Medications: Current Facility-Administered Medications  Medication Dose Route Frequency Provider Last Rate Last Admin   acetaminophen (TYLENOL) tablet 650 mg  650 mg Oral Q6H PRN [place] Bing, MD   650 mg at 06/14/22 2045   enoxaparin (LOVENOX) injection 40 mg  40 mg Subcutaneous Q24H [place] Bing, MD   40 mg at 06/14/22 1940   folic acid (FOLVITE) tablet 1 mg  1 mg Oral Daily [place] Bing, MD   1 mg at 06/15/22 1020   furosemide (LASIX) tablet 20 mg  20 mg Oral BID [place] Bing, MD   20 mg at 06/15/22 0827   gabapentin (NEURONTIN) capsule 200 mg  200 mg Oral BID [place] Bing, MD   200 mg at 06/15/22 1019   HYDROmorphone (DILAUDID) injection 0.2-0.6 mg  0.2-0.6 mg Intravenous Q2H PRN [place] Bing, MD       labetalol (NORMODYNE) tablet 300 mg  300 mg Oral BID [place] Bing, MD   300 mg at 06/15/22 1019  ondansetron (ZOFRAN) tablet 4 mg  4 mg Oral Q6H PRN [place] Bing, MD       Or   ondansetron (ZOFRAN) injection 4 mg  4 mg Intravenous Q6H PRN [place] Bing, MD       oxyCODONE (Oxy IR/ROXICODONE) immediate release tablet 5-10 mg  5-10 mg Oral Q4H PRN [place] Bing, MD   5 mg at 06/15/22 1023   pantoprazole (PROTONIX) EC tablet 40 mg  40 mg Oral Daily [place] Bing, MD   40 mg at 06/14/22 1027   piperacillin-tazobactam (ZOSYN) IVPB 3.375 g  3.375 g Intravenous [place] Liv, MD   Stopped at 06/15/22 1020   polyethylene glycol (MIRALAX / GLYCOLAX) packet 17 g  17 g Oral Daily [place] Bing, MD   17 g at 06/15/22 1020   senna (SENOKOT) tablet 8.6 mg  1 tablet Oral QHS PRN [place] Bing, MD       simethicone (MYLICON) chewable tablet 80 mg  80 mg Oral  QID PRN [place] Bing, MD   80 mg at 06/14/22 0830   vancomycin (VANCOREADY) IVPB 1250 mg/250 mL  1,250 mg Intravenous Q24H  Bing, MD 166.7 mL/hr at 06/14/22 1212 1,250 mg at 06/14/22 1212    Labs:  Pending: BCx x 2 (NGTD)  Recent Labs  Lab 06/13/22 0424 06/14/22 0417 06/15/22 0404  WBC 15.8* 15.6* 11.3*  HGB 8.4* 7.9* 7.7*  HCT 23.6* 22.5* 21.4*  PLT 102* 112* 116*    Recent Labs  Lab 06/13/22 0424 06/14/22 0417 06/15/22 0404  NA 136 136 134*  K 3.8 4.0 3.5  CL 106 107 104  CO2 23 23 22   BUN 11 7 7   CREATININE 1.19* 1.13* 1.16*  CALCIUM 8.3* 8.4* 8.1*  PROT 5.1* 5.2* 5.4*  BILITOT 1.6* 1.6* 1.0  ALKPHOS 69 76 74  ALT 13 10 9   AST 16 15 12*  GLUCOSE 102* 99 91   HIV neg Radiology:  No new imaging  Assessment & Plan:  Pt stable *PP: routine care. Patient consented for circ; baby may leave nicu tomorrow *ID: f/u qday labs. *Renal: Cr stable; follow qday. Consider renal consult tomorrow *Heme: continue qday CBCs *GHTN: lasix 5 days, continue labetalol *PPx: lovenox. Oob ad lib *FEN/GI: SLIV.   MD Attending Center for [facility] (Faculty Practice) GYN Consult Phone: [phone] (M-F, [phone]) & [phone]  (Off hours, weekends, holidays)

## 2022-06-16 ENCOUNTER — Encounter (HOSPITAL_COMMUNITY): Payer: Self-pay | Admitting: Obstetrics and Gynecology

## 2022-06-16 ENCOUNTER — Ambulatory Visit: Payer: Medicaid Other

## 2022-06-16 DIAGNOSIS — O8612 Endometritis following delivery: Principal | ICD-10-CM

## 2022-06-16 DIAGNOSIS — M7981 Nontraumatic hematoma of soft tissue: Secondary | ICD-10-CM

## 2022-06-16 LAB — COMPREHENSIVE METABOLIC PANEL
ALT: 9 U/L (ref 0–44)
AST: 11 U/L — ABNORMAL LOW (ref 15–41)
Albumin: 2.2 g/dL — ABNORMAL LOW (ref 3.5–5.0)
Alkaline Phosphatase: 72 U/L (ref 38–126)
Anion gap: 6 (ref 5–15)
BUN: 7 mg/dL (ref 6–20)
CO2: 24 mmol/L (ref 22–32)
Calcium: 8.5 mg/dL — ABNORMAL LOW (ref 8.9–10.3)
Chloride: 106 mmol/L (ref 98–111)
Creatinine, Ser: 1.16 mg/dL — ABNORMAL HIGH (ref 0.44–1.00)
GFR, Estimated: >60 mL/min (ref >60)
Glucose, Bld: 111 mg/dL — ABNORMAL HIGH (ref 70–99)
Potassium: 3.9 mmol/L (ref 3.5–5.1)
Sodium: 136 mmol/L (ref 135–145)
Total Bilirubin: 1 mg/dL (ref 0.3–1.2)
Total Protein: 5.7 g/dL — ABNORMAL LOW (ref 6.5–8.1)

## 2022-06-16 LAB — CBC WITH DIFFERENTIAL/PLATELET
Abs Immature Granulocytes: 0.07 10*3/uL (ref 0.00–0.07)
Basophils Absolute: 0 10*3/uL (ref 0.0–0.1)
Basophils Relative: 0 %
Eosinophils Absolute: 0.1 10*3/uL (ref 0.0–0.5)
Eosinophils Relative: 1 %
HCT: 22.9 % — ABNORMAL LOW (ref 36.0–46.0)
Hemoglobin: 8.1 g/dL — ABNORMAL LOW (ref 12.0–15.0)
Immature Granulocytes: 1 %
Lymphocytes Relative: 14 %
Lymphs Abs: 1.4 10*3/uL (ref 0.7–4.0)
MCH: 27.9 pg (ref 26.0–34.0)
MCHC: 35.4 g/dL (ref 30.0–36.0)
MCV: 79 fL — ABNORMAL LOW (ref 80.0–100.0)
Monocytes Absolute: 0.7 10*3/uL (ref 0.1–1.0)
Monocytes Relative: 7 %
Neutro Abs: 7.4 10*3/uL (ref 1.7–7.7)
Neutrophils Relative %: 77 %
Platelets: 158 10*3/uL (ref 150–400)
RBC: 2.9 MIL/uL — ABNORMAL LOW (ref 3.87–5.11)
RDW: 16.2 % — ABNORMAL HIGH (ref 11.5–15.5)
WBC: 9.7 10*3/uL (ref 4.0–10.5)
nRBC: 0 % (ref 0.0–0.2)

## 2022-06-16 MED ORDER — DOXYCYCLINE HYCLATE 100 MG PO TABS
100.0000 mg | ORAL_TABLET | Freq: Two times a day (BID) | ORAL | Status: DC
  Administered 2022-06-16 – 2022-06-17 (×2): 100 mg via ORAL
  Filled 2022-06-16 (×3): qty 1

## 2022-06-16 MED ORDER — AMOXICILLIN-POT CLAVULANATE 875-125 MG PO TABS
1.0000 | ORAL_TABLET | Freq: Two times a day (BID) | ORAL | Status: DC
  Administered 2022-06-16 – 2022-06-17 (×3): 1 via ORAL
  Filled 2022-06-16 (×3): qty 1

## 2022-06-16 NOTE — Progress Notes (Signed)
[facility] Infectious Diseases Follow Up Note  Patient Identification: Patient Name: Sonya Tapia MRN: [id number] Admit Date: 06/12/2022  9:23 AM Age: 31 y.o.Today's Date: 06/16/2022  Reason for Visit: fevers  Principal Problem:   Postpartum endometritis Active Problems:   Rectus sheath hematoma   Sepsis without acute organ dysfunction (HCC)   Hydronephrosis with ureteropelvic junction (UPJ) obstruction   FUO (fever of unknown origin)   Antibiotics:  Zosyn 7/27-c Vancomycin 7/29-c   Lines/Hardwares:   Interval Events: afebrile for approx 48 hrs. Leukocytosis has resolved    Assessment` 68 Y O female with PMH of thrombocytopenia, estrogen induced DVT, sickle cell disease G2 P2002 status post emergency C-section 7/22 for uterine rupture after IOL GHTN at 37 weeks admitted with fevers and abdominal pain.  Found to have  #Large left rectus muscle hematoma extending into left oblique abdominal muscles including enlarged postpartum uterus with a small blush of contrast in the upper left endometrial area could be a site of active bleeding  Recommendations She told me she is not planning to breast feed. I  discussed with her about need to adjust antibiotics esp doxycycline in case she is planning for breast feeding  Okay to switch to doxycycline and Augmentin today.  If afebrile by tomorrow morning can be discharged on dual antibiotics to complete 14 days of treatment( IV and PO). EOT 06/27/22  Fu appt with myself made on 07/04/22 at [facility] D/w patient and primary  ID will sign off.   Rest of the management as per the primary team. Thank you for the consult. Please page with pertinent questions or concerns.  ______________________________________________________________________ Subjective patient seen and examined at the bedside.  Doing well with no concerns except some mild tenderness at the C section site   Past  Medical History:  Diagnosis Date   DVT (deep venous thrombosis) (HCC)    Pregnancy induced hypertension    Sickle cell anemia (HCC)    Past Surgical History:  Procedure Laterality Date   CESAREAN SECTION     CESAREAN SECTION N/A 06/07/2022   Procedure: CESAREAN SECTION;  Surgeon: Tereso Newcomer, MD;  Location: [facility];  Service: Obstetrics;  Laterality: N/A;    Vitals BP (!) 143/96 (BP Location: Left Arm) Comment: RN notified  Pulse 79   Temp 98.4 F (36.9 C) (Oral)   Resp 16   Ht  (1.626 m)   LMP 09/17/2021   SpO2 96%   BMI 34.33 kg/m     Physical Exam Constitutional:  sitting up in the bed, comfortable     Comments:   Cardiovascular:     Rate and Rhythm: Normal rate and regular rhythm.     Heart sounds:    Pulmonary:     Effort: Pulmonary effort is normal.     Comments: Bilateral normal breath sounds   Abdominal:     Palpations: Abdomen is soft.     Tenderness: post partum abdomen, C section site scar is healthy   Musculoskeletal:        General: No swelling or tenderness.   Skin:    Comments: No obvious rashes   Neurological:     General: No focal deficit present. Awake, alert and oriented   Psychiatric:        Mood and Affect: Mood normal.   Pertinent Microbiology Results for orders placed or performed during the hospital encounter of 06/12/22  Resp Panel by RT-PCR (Flu A&B, Covid) Anterior Nasal Swab     Status:  None   Collection Time: 06/12/22  2:05 PM   Specimen: Anterior Nasal Swab  Result Value Ref Range Status   SARS Coronavirus 2 by RT PCR NEGATIVE NEGATIVE Final    Comment: (NOTE) SARS-CoV-2 target nucleic acids are NOT DETECTED.  The SARS-CoV-2 RNA is generally detectable in upper respiratory specimens during the acute phase of infection. The lowest concentration of SARS-CoV-2 viral copies this assay can detect is 138 copies/mL. A negative result does not preclude SARS-Cov-2 infection and should not be used as the sole basis  for treatment or other patient management decisions. A negative result may occur with  improper specimen collection/handling, submission of specimen other than nasopharyngeal swab, presence of viral mutation(s) within the areas targeted by this assay, and inadequate number of viral copies(<138 copies/mL). A negative result must be combined with clinical observations, patient history, and epidemiological information. The expected result is Negative.  Fact Sheet for Patients:  BloggerCourse.com  Fact Sheet for Healthcare Providers:  SeriousBroker.it  This test is no t yet approved or cleared by the [place] FDA and  has been authorized for detection and/or diagnosis of SARS-CoV-2 by FDA under an Emergency Use Authorization (EUA). This EUA will remain  in effect (meaning this test can be used) for the duration of the COVID-19 declaration under Section 564(b)(1) of the Act, 21 U.S.C.section 360bbb-3(b)(1), unless the authorization is terminated  or revoked sooner.       Influenza A by PCR NEGATIVE NEGATIVE Final   Influenza B by PCR NEGATIVE NEGATIVE Final    Comment: (NOTE) The Xpert Xpress SARS-CoV-2/FLU/RSV plus assay is intended as an aid in the diagnosis of influenza from Nasopharyngeal swab specimens and should not be used as a sole basis for treatment. Nasal washings and aspirates are unacceptable for Xpert Xpress SARS-CoV-2/FLU/RSV testing.  Fact Sheet for Patients: BloggerCourse.com  Fact Sheet for Healthcare Providers: SeriousBroker.it  This test is not yet approved or cleared by the [place] FDA and has been authorized for detection and/or diagnosis of SARS-CoV-2 by FDA under an Emergency Use Authorization (EUA). This EUA will remain in effect (meaning this test can be used) for the duration of the COVID-19 declaration under Section 564(b)(1) of the Act, 21  U.S.C. section 360bbb-3(b)(1), unless the authorization is terminated or revoked.  Performed at [facility], [address], [place], [place] [address]   Blood Culture (routine x 2)     Status: None (Preliminary result)   Collection Time: 06/12/22  2:35 PM   Specimen: BLOOD  Result Value Ref Range Status   Specimen Description BLOOD BLOOD RIGHT FOREARM  Final   Special Requests   Final    BOTTLES DRAWN AEROBIC AND ANAEROBIC Blood Culture adequate volume   Culture   Final    NO GROWTH 4 DAYS Performed at [facility], [address], [place], [place] [address]    Report Status PENDING  Incomplete  Blood Culture (routine x 2)     Status: None (Preliminary result)   Collection Time: 06/12/22  5:13 PM   Specimen: BLOOD  Result Value Ref Range Status   Specimen Description BLOOD LEFT ANTECUBITAL  Final   Special Requests   Final    BOTTLES DRAWN AEROBIC AND ANAEROBIC Blood Culture adequate volume   Culture   Final    NO GROWTH 4 DAYS Performed at [facility], [address] [place], [place] [address]    Report Status PENDING  Incomplete  Culture, OB Urine  Status: None   Collection Time: 06/14/22  9:50 AM   Specimen: Urine, Random  Result Value Ref Range Status   Specimen Description URINE, RANDOM  Final   Special Requests NONE  Final   Culture   Final    NO GROWTH Performed at [facility], [address], [place], [place] [address]    Report Status 06/15/2022 FINAL  Final    Pertinent Lab.    Latest Ref Rng & Units 06/16/2022    5:17 AM 06/15/2022    4:04 AM 06/14/2022    4:17 AM  CBC  WBC 4.0 - 10.5 K/uL 9.7  11.3  15.6   Hemoglobin 12.0 - 15.0 g/dL 8.1  7.7  7.9   Hematocrit 36.0 - 46.0 % 22.9  21.4  22.5   Platelets 150 - 400 K/uL 158  116  112       Latest Ref Rng & Units 06/16/2022    5:17 AM 06/15/2022    4:04 AM 06/14/2022    4:17 AM  CMP  Glucose 70 - 99 mg/dL 604  91  99   BUN 6 - 20 mg/dL Creatinine 0.44 - 1.00  mg/dL 5.40  9.81  1.91   Sodium 135 - 145 mmol/L 136  134  136   Potassium 3.5 - 5.1 mmol/L 3.9  3.5  4.0   Chloride 98 - 111 mmol/L 106  104  107   CO2 22 - 32 mmol/L Calcium 8.9 - 10.3 mg/dL 8.5  8.1  8.4   Total Protein 6.5 - 8.1 g/dL 5.7  5.4  5.2   Total Bilirubin 0.3 - 1.2 mg/dL 1.0  1.0  1.6   Alkaline Phos 38 - 126 U/L 72  74  76   AST 15 - 41 U/L ALT 0 - 44 U/L Pertinent Imaging today Plain films and CT images have been personally visualized and interpreted; radiology reports have been reviewed. Decision making incorporated into the Impression / Recommendations.  DG Chest Port 1 View  Result Date: 06/12/2022 CLINICAL DATA:  Left upper quadrant abdominal pain starting yesterday. Four days postpartum EXAM: PORTABLE CHEST 1 VIEW COMPARISON:  Overlapping portions CT abdomen 06/12/2022 FINDINGS: Striped artifact over the right chest, probably from blankets or similar radiodensity outside of the patient. The lungs appear clear. Heart size within normal limits for projection. Cardiac and mediastinal contours normal. No blunting of the costophrenic angles. IMPRESSION: 1.  No active cardiopulmonary disease is radiographically apparent. Electronically Signed   By: Gaylyn Rong M.D.   On: 06/12/2022 13:46   CT ABDOMEN PELVIS W CONTRAST  Result Date: 06/12/2022 CLINICAL DATA:  Four days postpartum. Left upper quadrant abdominal pain. EXAM: CT ABDOMEN AND PELVIS WITH CONTRAST TECHNIQUE: Multidetector CT imaging of the abdomen and pelvis was performed using the standard protocol following bolus administration of intravenous contrast. RADIATION DOSE REDUCTION: This exam was performed according to the departmental dose-optimization program which includes automated exposure control, adjustment of the mA and/or kV according to patient size and/or use of iterative reconstruction technique. CONTRAST:  OMNIPAQUE IOHEXOL 300 MG/ML  SOLN COMPARISON:  None  Available. FINDINGS: Lower chest: Breathing motion artifact but no infiltrates or effusions. No pneumothorax. Streaky bibasilar atelectasis. Hepatobiliary: No hepatic lesions or intrahepatic biliary dilatation. The gallbladder is unremarkable. No common bile duct dilatation.  Pancreas: No mass, inflammation or ductal dilatation. Spleen: Marked splenomegaly. The spleen measures 18 x 15 x 10 cm. No findings for splenic infarction. Adrenals/Urinary Tract: Adrenal glands and left kidney are unremarkable. The right kidney demonstrates marked hydronephrosis. There is significant hydroureter down to the pelvis where the ureter is likely compressed by the enlarged uterus. The bladder is unremarkable. Stomach/Bowel: The stomach, duodenum, small bowel and colon are grossly normal. Vascular/Lymphatic: The aorta and branch vessels are normal. The major venous structures are patent. Reproductive: Enlarged postpartum uterus small blush of contrast in the upper left endometrial area could be a site of active bleeding. Recommend correlation with any vaginal bleeding. Other: The left rectus and oblique abdominal muscles are markedly enlarged and edematous. There appears to be sizable hematoma involving the lower left rectus muscle likely accounting for the patient's pain. I do not see any active extravasation of contrast material to suggest active bleed. Some gas is noted in the oblique abdominal muscles bilaterally likely related to the patient's surgery. Musculoskeletal: No significant bony findings. IMPRESSION: 1. Enlarged postpartum uterus with a small blush of contrast in the upper left endometrial area could be a site of active bleeding. Recommend correlation with any vaginal bleeding. 2. Large hematoma involving the left rectus muscle extending into the left oblique abdominal muscles likely accounting for the patient's left lower quadrant pain. 3. Marked splenomegaly. 4. Marked right-sided hydronephrosis and hydroureter down  to the pelvis where the ureter is likely compressed by the enlarged uterus. Electronically Signed   By: Rudie Meyer M.D.   On: 06/12/2022 12:27   DG Abd Portable 1V  Result Date: 06/07/2022 CLINICAL DATA:  Status post emergency C-section EXAM: PORTABLE ABDOMEN - 1 VIEW COMPARISON:  None Available. FINDINGS: Epidural catheter terminating at the L2-3 level. No radiopaque foreign body is seen. Nonobstructive bowel gas pattern. Visualized osseous structures are within normal limits. IMPRESSION: Epidural catheter terminating at the L2-3 level. No radiopaque foreign body is seen. Electronically Signed   By: Charline Bills M.D.   On: 06/07/2022 21:18   [place] MFM FETAL BPP WO NON STRESS  Result Date: 06/04/2022 ----------------------------------------------------------------------  OBSTETRICS REPORT                       (Signed Final 06/04/2022 04:39 pm) ---------------------------------------------------------------------- Patient Info  ID #:       [id number]                          D.O.B.:  07/28/1991 (30 yrs)  Name:       Sonya Tapia                 Visit Date: 06/04/2022 04:23 pm ---------------------------------------------------------------------- Performed By  Attending:        Lin Landsman      Ref. Address:     [address]                    [place]                                                             [place], [place]  [address]  Performed By:     Joanna Hews         [facility]  Referred By:      [facility] ---------------------------------------------------------------------- Orders  #  Description                           Code        Ordered By  1  [place] MFM FETAL BPP  WO NON               76819.01    RAVI SHANKAR     STRESS  2  [place] MFM OB FOLLOW UP                   [id number]    RAVI SHANKAR ----------------------------------------------------------------------  #  Order #                     Accession #                Episode #  1  [id number]                   [id number]                 [id number]  2  [id number]                   [id number]                 [id number] ---------------------------------------------------------------------- Indications  [redacted] weeks gestation of pregnancy                Z3A.36  Gestational hypertension without significant   O13.3  proteinuria, third trimester  Poor obstetrical history (Deep Vein            O09.299  Thrombosis)-on lovenox  Maternal sickle cell anemia, third trimester   O99.013, D57.1  Obesity complicating pregnancy, third          O99.213  trimester (BMI 30)  Previous cesarean delivery, antepartum         O34.219  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 Cord Insertion:      Previously Visualized  Amniotic Fluid  AFI FV:      Within normal limits  AFI Sum(cm)     %Tile       Largest Pocket(cm)  6.01            < 3         4.32  RUQ(cm)       RLQ(cm)       LUQ(cm)        LLQ(cm)  1.69          0             0              4.32 ---------------------------------------------------------------------- Biophysical Evaluation  Amniotic F.V:   Within normal limits       F. Tone:        Observed  F. Movement:    Observed                   Score:          8/8  F. Breathing:   Observed  ---------------------------------------------------------------------- Biometry  BPD:     90.77  mm     G. Age:  36w 6d         66  %    CI:        77.36   %    70 - 86                                                          FL/HC:      21.3   %    20.8 - 22.6  HC:    326.72   mm     G. Age:  37w 0d         30  %    HC/AC:      0.95        0.92 - 1.05  AC:    343.51   mm     G. Age:  38w 2d         93  %    FL/BPD:     76.7   %    71 - 87  FL:      69.64  mm     G. Age:  35w 5d         23  %    FL/AC:      20.3   %    20 - 24  Est. FW:    3190  gm      7 lb 1 oz     72  % ---------------------------------------------------------------------- OB History  Gravidity:    2         Term:   1  Prem:   0        SAB:   0  TOP:          0       Ectopic:  0        Living: 1 ---------------------------------------------------------------------- Gestational Age  LMP:           37w 1d        Date:  09/17/21                  EDD:   06/24/22  U/S Today:     37w 0d                                        EDD:   06/25/22  Best:          36w 5d     Det. By:  U/S  (02/06/22)          EDD:   06/27/22 ---------------------------------------------------------------------- Anatomy  Cranium:               Appears normal         Abdomen:                Appears normal  Heart:                 Appears normal         Kidneys:                Appear normal                         (4CH, axis, and                         situs)  Diaphragm:             Appears normal         Bladder:                Appears normal  Stomach:               Appears normal, left                         sided ---------------------------------------------------------------------- Cervix Uterus Adnexa  Cervix  Not visualized (advanced GA >24wks)  Uterus  No abnormality visualized.  Right Ovary  Not visualized.  Left Ovary  Not visualized. 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  Normal interval  growth with measurements consistent with  dates  Good fetal movement and amniotic fluid volume ---------------------------------------------------------------------- Recommendations  Continue weekly testing given history of Hgb [place] disease ----------------------------------------------------------------------              Lin Landsman, MD Electronically Signed Final Report   06/04/2022 04:39 pm ----------------------------------------------------------------------  [place] MFM OB FOLLOW UP  Result Date: 06/04/2022 ----------------------------------------------------------------------  OBSTETRICS REPORT                       (Signed Final 06/04/2022 04:39 pm) ---------------------------------------------------------------------- Patient Info  ID #:       [id number]                          D.O.B.:  July 10, 1991 (30 yrs)  Name:  Sonya Tapia                 Visit Date: 06/04/2022 04:23 pm ---------------------------------------------------------------------- Performed By  Attending:        Lin Landsmanorenthian Booker      Ref. Address:     [address]                    [place]                                                             [place], [place]                                                             [id number]  Performed By:     Joanna HewsAlexis Metcalf         [facility]  [facility]      [facility] ---------------------------------------------------------------------- Orders  #  Description                           Code        Ordered By  1  US MFM FETAL BPP WO NON               76819.01    RAVI SHANKAR     STRESS  2  US MFM OB FOLLOW UP                   [id number]    [facility] ----------------------------------------------------------------------  #   Order #                     Accession #                Episode #  1  [id number]  [id number]                 [id number]  2  [id number]                   [id number]                 [id number] ---------------------------------------------------------------------- Indications  [redacted] weeks gestation of pregnancy                Z3A.36  Gestational hypertension without significant   O13.3  proteinuria, third trimester  Poor obstetrical history (Deep Vein            O09.299  Thrombosis)-on lovenox  Maternal sickle cell anemia, third trimester   O99.013, D57.1  Obesity complicating pregnancy, third          O99.213  trimester (BMI 30)  Previous cesarean delivery, antepartum         O34.219  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 Cord Insertion:      Previously Visualized  Amniotic Fluid  AFI FV:      Within normal limits  AFI Sum(cm)     %Tile       Largest Pocket(cm)  6.01            < 3         4.32  RUQ(cm)       RLQ(cm)       LUQ(cm)        LLQ(cm)  1.69          0             0              4.32 ---------------------------------------------------------------------- Biophysical Evaluation  Amniotic F.V:   Within normal limits       F. Tone:        Observed  F. Movement:    Observed                   Score:          8/8  F. Breathing:   Observed ---------------------------------------------------------------------- Biometry  BPD:     90.77  mm     G. Age:  36w 6d         66  %    CI:        77.36   %    70 - 86  FL/HC:      21.3   %    20.8 - 22.6  HC:    326.72   mm     G. Age:  37w 0d         30  %    HC/AC:      0.95        0.92 - 1.05  AC:    343.51   mm     G. Age:  38w 2d         93  %    FL/BPD:     76.7   %    71 - 87  FL:      69.64  mm     G. Age:  35w 5d         23  %    FL/AC:      20.3   %    20 - 24  Est. FW:    3190  gm      7 lb 1 oz     72  % ---------------------------------------------------------------------- OB History  Gravidity:    2         Term:   1        Prem:   0        SAB:   0  TOP:          0       Ectopic:  0        Living: 1 ---------------------------------------------------------------------- Gestational Age  LMP:           37w 1d        Date:  09/17/21                  EDD:   06/24/22  U/S Today:     37w 0d                                        EDD:   06/25/22  Best:          36w 5d     Det. By:  U/S  (02/06/22)          EDD:   06/27/22 ---------------------------------------------------------------------- Anatomy  Cranium:               Appears normal         Abdomen:                Appears normal  Heart:                 Appears normal         Kidneys:                Appear normal                         (4CH, axis, and                         situs)  Diaphragm:             Appears normal         Bladder:                Appears normal  Stomach:               Appears normal, left  sided ---------------------------------------------------------------------- Cervix Uterus Adnexa  Cervix  Not visualized (advanced GA >24wks)  Uterus  No abnormality visualized.  Right Ovary  Not visualized.  Left Ovary  Not visualized. 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  Normal interval growth with measurements consistent with  dates  Good fetal movement and amniotic fluid volume ---------------------------------------------------------------------- Recommendations  Continue weekly testing given history  of Hgb [place] disease ----------------------------------------------------------------------              Lin Landsman, MD Electronically Signed Final Report   06/04/2022 04:39 pm ----------------------------------------------------------------------  [place] MFM FETAL BPP WO NON STRESS  Result Date: 05/29/2022 ----------------------------------------------------------------------  OBSTETRICS REPORT                       (Signed Final 05/29/2022 12:08 pm) ---------------------------------------------------------------------- Patient Info  ID #:       [id number]                          D.O.B.:  04/13/1991 (30 yrs)  Name:       Sonya Tapia                 Visit Date: 05/29/2022 07:33 am ---------------------------------------------------------------------- Performed By  Attending:        Noralee Space MD        Ref. Address:     [address]                                                             [place], [place]                                                             [id number]  Performed By:     Lowanda Foster Pharisien     Location[facility]                                     [facility]  Referred By:      [facility] ---------------------------------------------------------------------- Orders  #  Description                           Code        Ordered By  1  [place] MFM FETAL BPP WO NON               76819.01    RAVI [facility]     STRESS ----------------------------------------------------------------------  #  Order #                     Accession #                Episode #  1  [id number]                   [id number]                 [id number] ---------------------------------------------------------------------- Indications  Gestational hypertension without significant   O13.3  proteinuria, third trimester   Poor obstetrical history (Deep Vein            O09.299  Thrombosis)-on lovenox  Maternal sickle cell anemia, third trimester   O99.013, D57.1  Obesity complicating pregnancy, third          O99.213  trimester (BMI 30)  Previous cesarean delivery, antepartum         O34.219  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 Cord Insertion:      Previously Visualized  Amniotic Fluid  AFI FV:      Within normal limits  AFI Sum(cm)     %Tile       Largest Pocket(cm)  9.47            18          5.69  RUQ(cm)       RLQ(cm)       LUQ(cm)        LLQ(cm)  3.78          0             5.69           0 ---------------------------------------------------------------------- Biophysical Evaluation  Amniotic F.V:   Pocket => 2 cm             F. Tone:        Observed  F. Movement:    Observed                   Score:  8/8  F. Breathing:   Observed ---------------------------------------------------------------------- Biometry  LV:        3.7  mm ---------------------------------------------------------------------- OB History  Gravidity:    2         Term:   1        Prem:   0        SAB:   0  TOP:          0       Ectopic:  0        Living: 1 ---------------------------------------------------------------------- Gestational Age  LMP:           36w 2d        Date:  09/17/21                  EDD:   06/24/22  Best:          Consuello Closs 6d     Det. By:  U/S  (02/06/22)          EDD:   06/27/22  ---------------------------------------------------------------------- Anatomy  Cranium:               Appears normal         LVOT:                   Previously seen  Cavum:                 Appears normal         Aortic Arch:            Previously seen  Ventricles:            Appears normal         Ductal Arch:            Previously seen  Choroid Plexus:        Previously seen        Diaphragm:              Appears normal  Cerebellum:            Previously seen        Stomach:                Appears normal, left                                                                        sided  Posterior Fossa:       Previously seen        Abdomen:                Appears normal  Nuchal Fold:           Previously seen        Abdominal Wall:         Previously seen  Face:                  Orbits and profile     Cord Vessels:           Previously seen                         previously seen  Lips:  Previously seen        Kidneys:                Appear normal  Palate:                Previously seen        Bladder:                Appears normal  Thoracic:              Appears normal         Spine:                  Previously seen  Heart:                 Previously seen        Upper Extremities:      Previously seen  RVOT:                  Previously seen        Lower Extremities:      Previously seen  Other:  VC, 3VV, 3VTV nasal bone, lenses, maxilla, mandible, falx, heels/feet          and open hands/5th digits previously visualized. Fetus appears to be          a female. ---------------------------------------------------------------------- Cervix Uterus Adnexa  Cervix  Not visualized (advanced GA >24wks)  Uterus  Normal shape and size.  Right Ovary  Not visualized.  Left Ovary  Not visualized. ---------------------------------------------------------------------- Impression  Sickle cell anemia.  History of DVT.  Patient takes Lovenox prophylaxis.  Patient had a prenatal visit appointment yesterday and her   blood pressures were 132/98 and 138/102 mmHg.  She does  not have signs and symptoms of severe features of  preeclampsia.  Blood pressures today at her office were 137/96 and 128/89  mmHg.  Amniotic fluid is normal and good fetal activity seen.  Antenatal testing is reassuring.  Cephalic presentation.  BPP  8/8.  I explained the finding of hypertension consistent with  gestational hypertension.  Discussed timing of delivery.  I  recommend delivery at [redacted] weeks gestation. ---------------------------------------------------------------------- Recommendations  -BPP next week.  -Recommend delivery at [redacted] weeks gestation because of  gestational hypertension.  -Consider switching to unfractionated heparin 10,000 units  twice daily at least 48 hours before delivery. ----------------------------------------------------------------------                 Noralee Space, MD Electronically Signed Final Report   05/29/2022 12:08 pm ----------------------------------------------------------------------  [place] MFM FETAL BPP WO NON STRESS  Result Date: 05/21/2022 ----------------------------------------------------------------------  OBSTETRICS REPORT                       (Signed Final 05/21/2022 03:45 pm) ---------------------------------------------------------------------- Patient Info  ID #:       [id number]                          D.O.B.:  March 01, 1991 (30 yrs)  Name:       Sonya Tapia                 Visit Date: 05/21/2022 02:50 pm ---------------------------------------------------------------------- Performed By  Attending:        Lin Landsman      Ref. Address:     [address]  [place]                                                             [place], [place]                                                             [address]  Performed By:     Charlyne Petrin     [facility]                                     [facility]  Referred By:      [facility] ---------------------------------------------------------------------- Orders  #  Description                           Code        Ordered By  1  [place] MFM FETAL BPP WO NON               76819.01    RAVI [facility]     STRESS ----------------------------------------------------------------------  #  Order #                     Accession #                Episode #  1  [id number]                   [id number]                 [id number] ---------------------------------------------------------------------- Indications  Poor obstetrical history (Deep Vein            O09.299  Thrombosis)-on lovenox  Maternal sickle cell anemia, third trimester   O99.013, D57.1  Obesity complicating pregnancy, third          O99.213  trimester (BMI 30)  Previous cesarean delivery, antepartum         O34.219  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 Cord Insertion:      Previously Visualized  Amniotic Fluid  AFI FV:      Within normal limits  AFI Sum(cm)     %Tile       Largest Pocket(cm)  9.79            19          3.94  RUQ(cm)       RLQ(cm)       LUQ(cm)        LLQ(cm)  3.94          2.43          1.55           1.87 ---------------------------------------------------------------------- Biophysical Evaluation  Amniotic F.V:   Pocket => 2 cm              F. Tone:        Observed  F. Movement:    Observed                   Score:          8/8  F. Breathing:   Observed ---------------------------------------------------------------------- Biometry  LV:          7  mm ---------------------------------------------------------------------- OB History  Gravidity:    2         Term:   1        Prem:   0        SAB:   0  TOP:          0       Ectopic:  0        Living: 1 ---------------------------------------------------------------------- Gestational Age  LMP:           35w 1d        Date:  09/17/21                  EDD:   06/24/22  Best:          34w 5d     Det. By:  U/S  (02/06/22)          EDD:   06/27/22 ---------------------------------------------------------------------- Anatomy  Cranium:               Appears normal         LVOT:  Appears normal  Cavum:                 Appears normal         Aortic Arch:            Previously seen  Ventricles:            Appears normal         Ductal Arch:            Previously seen  Choroid Plexus:        Previously seen        Diaphragm:              Appears normal  Cerebellum:            Previously seen        Stomach:                Appears normal, left                                                                        sided  Posterior Fossa:       Previously seen        Abdomen:                Appears normal  Nuchal Fold:           Previously seen        Abdominal Wall:         Previously seen  Face:                  Orbits and profile     Cord Vessels:           Previously seen                         previously seen  Lips:                  Previously seen        Kidneys:                Appear normal  Palate:                Previously seen        Bladder:                Appears normal  Thoracic:              Appears normal         Spine:                  Previously seen  Heart:                 Appears normal         Upper Extremities:      Previously seen                         (4CH, axis, and                          situs)  RVOT:  Appears normal         Lower Extremities:      Previously seen  Other:  VC, 3VV, 3VTV nasal bone, lenses, maxilla, mandible, falx, heels/feet          and open hands/5th digits previously visualized. Fetus appears to be          a female. ---------------------------------------------------------------------- Cervix Uterus Adnexa  Cervix  Not visualized (advanced GA >24wks)  Uterus  Normal shape and size.  Right Ovary  Within normal limits.  Left Ovary  Not visualized. ---------------------------------------------------------------------- Impression  Antenatal testing performed given maternal sickle cell  disease and elevated BMI with history of a thrombotic event.  The biophysical profile was 8/8 with good fetal movement and  amniotic fluid volume. ---------------------------------------------------------------------- Recommendations  Continue weekly testing. ----------------------------------------------------------------------              Lin Landsman, MD Electronically Signed Final Report   05/21/2022 03:45 pm ----------------------------------------------------------------------    I spent 55 minutes for this patient encounter including review of prior medical records, coordination of care with primary/other specialist with greater than 50% of time being face to face/counseling and discussing diagnostics/treatment plan with the patient/family.  Electronically signed by:   Odette Fraction, MD Infectious Disease Physician [facility] Pager: [phone]

## 2022-06-16 NOTE — Consult Note (Signed)
Subjective: 1. Rectus sheath hematoma, initial encounter   2. Hypertension, unspecified type   3. Gestational edema, antepartum   4. Sepsis without acute organ dysfunction, due to unspecified organism [facility])      Consult requested by Dr. Paulene Floor  I was asked to see [facility] for right hydronephrosis noted on a CT done for evaluation of abdominal pain in the LUQ and fever to 102 that occurred following discharge on 7/26 after an emergency C-section for uterine rupture at 37 wks.  She was on Lovenox post op and was noted to have a left rectus sheath hematoma on CT.  There was moderate hydroureteronephrosis down to the pelvis with probable compression by the enlarged uterus.  The patient reports no right flank pain, N/V, voiding complaints or a current fever.  Her UA was unremarkable and her Cr was only minimally increased at 1.16 from a base line of 0.93 and stability since 7/28.   She has no prior GU history.  ROS:  Review of Systems  Constitutional:  Negative for chills and fever.  Gastrointestinal:  Positive for abdominal pain (LUQ).  Genitourinary: Negative.     No Known Allergies  Past Medical History:  Diagnosis Date   DVT (deep venous thrombosis) (HCC)    Pregnancy induced hypertension    Sickle cell anemia (HCC)     Past Surgical History:  Procedure Laterality Date   CESAREAN SECTION     CESAREAN SECTION N/A 06/07/2022   Procedure: CESAREAN SECTION;  Surgeon: Tereso Newcomer, MD;  Location: [facility] LD ORS;  Service: Obstetrics;  Laterality: N/A;    Social History   Socioeconomic History   Marital status: Single    Spouse name: Not on file   Number of children: 1   Years of education: Not on file   Highest education level: Not on file  Occupational History   Not on file  Tobacco Use   Smoking status: Never   Smokeless tobacco: Never  Vaping Use   Vaping Use: Never used  Substance and Sexual Activity   Alcohol use: Not Currently    Comment: not while preg   Drug  use: Never   Sexual activity: Yes    Birth control/protection: Pill    Comment: spoke with OB about it at a previous appointment  Other Topics Concern   Not on file  Social History Narrative   Not on file   Social Determinants of Health   Financial Resource Strain: Not on file  Food Insecurity: Food Insecurity Present (04/24/2022)   Hunger Vital Sign    Worried About Running Out of Food in the Last Year: Sometimes true    Ran Out of Food in the Last Year: Never true  Transportation Needs: No Transportation Needs (04/24/2022)   PRAPARE - Administrator, Civil Service (Medical): No    Lack of Transportation (Non-Medical): No  Physical Activity: Not on file  Stress: Not on file  Social Connections: Not on file  Intimate Partner Violence: Not on file    Family History  Problem Relation Age of Onset   Hypertension Mother    Healthy Father     Anti-infectives: Anti-infectives (From admission, onward)    Start     Dose/Rate Route Frequency Ordered Stop   06/16/22 1400  doxycycline (VIBRA-TABS) tablet 100 mg        100 mg Oral Every 12 hours 06/16/22 1313 06/27/22 2359   06/16/22 1400  amoxicillin-clavulanate (AUGMENTIN) 875-125 MG per tablet 1 tablet  1 tablet Oral Every 12 hours 06/16/22 1313 06/27/22 2359   06/14/22 1200  vancomycin (VANCOREADY) IVPB 1250 mg/250 mL  Status:  Discontinued        1,250 mg 166.7 mL/hr over 90 Minutes Intravenous Every 24 hours 06/14/22 1111 06/16/22 1313   06/12/22 2100  piperacillin-tazobactam (ZOSYN) IVPB 3.375 g  Status:  Discontinued       See Hyperspace for full Linked Orders Report.   3.375 g 12.5 mL/hr over 240 Minutes Intravenous Every 8 hours 06/12/22 1332 06/16/22 1313   06/12/22 1345  piperacillin-tazobactam (ZOSYN) IVPB 3.375 g       See Hyperspace for full Linked Orders Report.   3.375 g 100 mL/hr over 30 Minutes Intravenous  Once 06/12/22 1332 06/12/22 1523   06/12/22 1330  cefTRIAXone (ROCEPHIN) 2 g in sodium  chloride 0.9 % 100 mL IVPB  Status:  Discontinued        2 g 200 mL/hr over 30 Minutes Intravenous  Once 06/12/22 1320 06/12/22 1322   06/12/22 1330  metroNIDAZOLE (FLAGYL) IVPB 500 mg  Status:  Discontinued        500 mg 100 mL/hr over 60 Minutes Intravenous  Once 06/12/22 1320 06/12/22 1322       Current Facility-Administered Medications  Medication Dose Route Frequency Provider Last Rate Last Admin   acetaminophen (TYLENOL) tablet 650 mg  650 mg Oral Q6H PRN [place] BingPickens, Charlie, MD   650 mg at 06/14/22 2045   amoxicillin-clavulanate (AUGMENTIN) 875-125 MG per tablet 1 tablet  1 tablet Oral Q12H [place] FractionManandhar, Sabina, MD   1 tablet at 06/16/22 1431   doxycycline (VIBRA-TABS) tablet 100 mg  100 mg Oral Q12H [place] FractionManandhar, Sabina, MD   100 mg at 06/16/22 1714   enoxaparin (LOVENOX) injection 40 mg  40 mg Subcutaneous Q24H [place] BingPickens, Charlie, MD   40 mg at 06/15/22 1956   folic acid (FOLVITE) tablet 1 mg  1 mg Oral Daily [place] BingPickens, Charlie, MD   1 mg at 06/16/22 [id number]   furosemide (LASIX) tablet 20 mg  20 mg Oral BID [place] BingPickens, Charlie, MD   20 mg at 06/16/22 1714   gabapentin (NEURONTIN) capsule 200 mg  200 mg Oral BID [place] BingPickens, Charlie, MD   200 mg at 06/15/22 2205   HYDROmorphone (DILAUDID) injection 0.2-0.6 mg  0.2-0.6 mg Intravenous Q2H PRN [place] BingPickens, Charlie, MD       labetalol (NORMODYNE) tablet 300 mg  300 mg Oral BID [place] BingPickens, Charlie, MD   300 mg at 06/16/22 0934   ondansetron (ZOFRAN) tablet 4 mg  4 mg Oral Q6H PRN [place] BingPickens, Charlie, MD       Or   ondansetron (ZOFRAN) injection 4 mg  4 mg Intravenous Q6H PRN [place] BingPickens, Charlie, MD       oxyCODONE (Oxy IR/ROXICODONE) immediate release tablet 5-10 mg  5-10 mg Oral Q4H PRN [place] BingPickens, Charlie, MD   5 mg at 06/15/22 1023   pantoprazole (PROTONIX) EC tablet 40 mg  40 mg Oral Daily [place] BingPickens, Charlie, MD   40 mg at 06/16/22 0933   polyethylene glycol (MIRALAX / GLYCOLAX) packet 17 g  17 g Oral Daily [place] BingPickens, Charlie, MD   17 g at 06/16/22 0931   senna (SENOKOT)  tablet 8.6 mg  1 tablet Oral QHS PRN [place] BingPickens, Charlie, MD       simethicone (MYLICON) chewable tablet 80 mg  80 mg Oral QID PRN [place] BingPickens, Charlie, MD   80 mg at 06/14/22 0830     Objective: Vital signs in last 24  hours: BP (!) 142/92 (BP Location: Left Arm)   Pulse 78   Temp 98 F (36.7 C) (Oral)   Resp 18   Ht 5\' 4"  (1.626 m)   LMP 09/17/2021   SpO2 100%   BMI 34.33 kg/m   Intake/Output from previous day: 07/30 0701 - 07/31 0700 In: 720 [P.O.:720] Out: 3200 [Urine:3200] Intake/Output this shift: Total I/O In: -  Out: 900 [Urine:900]   Physical Exam Vitals reviewed.  Constitutional:      Appearance: She is well-developed. She is obese.  Abdominal:     Comments: Soft, obese with no right flank tenderness.   Neurological:     Mental Status: She is alert.     Lab Results:  Results for orders placed or performed during the hospital encounter of 06/12/22 (from the past 24 hour(s))  Comprehensive metabolic panel     Status: Abnormal   Collection Time: 06/16/22  5:17 AM  Result Value Ref Range   Sodium 136 135 - 145 mmol/L   Potassium 3.9 3.5 - 5.1 mmol/L   Chloride 106 98 - 111 mmol/L   CO2 24 22 - 32 mmol/L   Glucose, Bld 111 (H) 70 - 99 mg/dL   BUN 7 6 - 20 mg/dL   Creatinine, Ser 06/18/22 (H) 0.44 - 1.00 mg/dL   Calcium 8.5 (L) 8.9 - 10.3 mg/dL   Total Protein 5.7 (L) 6.5 - 8.1 g/dL   Albumin 2.2 (L) 3.5 - 5.0 g/dL   AST 11 (L) 15 - 41 U/L   ALT 9 0 - 44 U/L   Alkaline Phosphatase 72 38 - 126 U/L   Total Bilirubin 1.0 0.3 - 1.2 mg/dL   GFR, Estimated 4.85 >46 mL/min   Anion gap 6 5 - 15  CBC with Differential/Platelet     Status: Abnormal   Collection Time: 06/16/22  5:17 AM  Result Value Ref Range   WBC 9.7 4.0 - 10.5 K/uL   RBC 2.90 (L) 3.87 - 5.11 MIL/uL   Hemoglobin 8.1 (L) 12.0 - 15.0 g/dL   HCT 06/18/22 (L) 03.5 - 00.9 %   MCV 79.0 (L) 80.0 - 100.0 fL   MCH 27.9 26.0 - 34.0 pg   MCHC 35.4 30.0 - 36.0 g/dL   RDW 38.1 (H) 82.9 - 93.7 %   Platelets 158 150  - 400 K/uL   nRBC 0.0 0.0 - 0.2 %   Neutrophils Relative % 77 %   Neutro Abs 7.4 1.7 - 7.7 K/uL   Lymphocytes Relative 14 %   Lymphs Abs 1.4 0.7 - 4.0 K/uL   Monocytes Relative 7 %   Monocytes Absolute 0.7 0.1 - 1.0 K/uL   Eosinophils Relative 1 %   Eosinophils Absolute 0.1 0.0 - 0.5 K/uL   Basophils Relative 0 %   Basophils Absolute 0.0 0.0 - 0.1 K/uL   Immature Granulocytes 1 %   Abs Immature Granulocytes 0.07 0.00 - 0.07 K/uL    BMET Recent Labs    06/15/22 0404 06/16/22 0517  NA 134* 136  K 3.5 3.9  CL 104 106  CO2 22 24  GLUCOSE 91 111*  BUN 7 7  CREATININE 1.16* 1.16*  CALCIUM 8.1* 8.5*   PT/INR No results for input(s): "LABPROT", "INR" in the last 72 hours. ABG No results for input(s): "PHART", "HCO3" in the last 72 hours.  Invalid input(s): "PCO2", "PO2"  Latest Reference Range & Units 06/12/22 10:38  Ketones, ur NEGATIVE mg/dL NEGATIVE    Latest Reference Range &  Units 06/12/22 10:38  URINALYSIS, ROUTINE W REFLEX MICROSCOPIC  Rpt !  Appearance CLEAR  CLEAR  Bilirubin Urine NEGATIVE  NEGATIVE  Color, Urine YELLOW  YELLOW  Glucose, UA NEGATIVE mg/dL NEGATIVE  Hgb urine dipstick NEGATIVE  LARGE !  Ketones, ur NEGATIVE mg/dL NEGATIVE  Leukocytes,Ua NEGATIVE  SMALL !  Nitrite NEGATIVE  NEGATIVE  pH 5.0 - 8.0  8.0  Protein NEGATIVE mg/dL NEGATIVE  Specific Gravity, Urine 1.005 - 1.030  1.009  Bacteria, UA NONE SEEN  RARE !  RBC / HPF 0 - 5 RBC/hpf 6-10  Squamous Epithelial / LPF 0 - 5  0-5  WBC, UA 0 - 5 WBC/hpf 0-5  !: Data is abnormal Rpt: View report in Results Review for more information Studies/Results: No results found. CLINICAL DATA:  Four days postpartum. Left upper quadrant abdominal pain.   EXAM: CT ABDOMEN AND PELVIS WITH CONTRAST   TECHNIQUE: Multidetector CT imaging of the abdomen and pelvis was performed using the standard protocol following bolus administration of intravenous contrast.   RADIATION DOSE REDUCTION: This exam was  performed according to the departmental dose-optimization program which includes automated exposure control, adjustment of the mA and/or kV according to patient size and/or use of iterative reconstruction technique.   CONTRAST:  OMNIPAQUE IOHEXOL 300 MG/ML  SOLN   COMPARISON:  None Available.   FINDINGS: Lower chest: Breathing motion artifact but no infiltrates or effusions. No pneumothorax. Streaky bibasilar atelectasis.   Hepatobiliary: No hepatic lesions or intrahepatic biliary dilatation. The gallbladder is unremarkable. No common bile duct dilatation.   Pancreas: No mass, inflammation or ductal dilatation.   Spleen: Marked splenomegaly. The spleen measures 18 x 15 x 10 cm. No findings for splenic infarction.   Adrenals/Urinary Tract: Adrenal glands and left kidney are unremarkable. The right kidney demonstrates marked hydronephrosis. There is significant hydroureter down to the pelvis where the ureter is likely compressed by the enlarged uterus. The bladder is unremarkable.   Stomach/Bowel: The stomach, duodenum, small bowel and colon are grossly normal.   Vascular/Lymphatic: The aorta and branch vessels are normal. The major venous structures are patent.   Reproductive: Enlarged postpartum uterus small blush of contrast in the upper left endometrial area could be a site of active bleeding. Recommend correlation with any vaginal bleeding.   Other: The left rectus and oblique abdominal muscles are markedly enlarged and edematous. There appears to be sizable hematoma involving the lower left rectus muscle likely accounting for the patient's pain. I do not see any active extravasation of contrast material to suggest active bleed. Some gas is noted in the oblique abdominal muscles bilaterally likely related to the patient's surgery.   Musculoskeletal: No significant bony findings.   IMPRESSION: 1. Enlarged postpartum uterus with a small blush of contrast in  the upper left endometrial area could be a site of active bleeding. Recommend correlation with any vaginal bleeding. 2. Large hematoma involving the left rectus muscle extending into the left oblique abdominal muscles likely accounting for the patient's left lower quadrant pain. 3. Marked splenomegaly. 4. Marked right-sided hydronephrosis and hydroureter down to the pelvis where the ureter is likely compressed by the enlarged uterus.     Electronically Signed   By: Rudie Meyer M.D.   On: 06/12/2022 12:27  Assessment/Plan: Right hydronephrosis without pain, fever or significant AKI.  This is probably physiologic secondary to the gravid uterus.   She will just need office f/u in 2-3 weeks for a renal US to evaluate for resolution.  I will contact my office to arrange f/u.         No follow-ups on file.    CC: Mariel Aloe MD and Paulene Floor MD     Bjorn Pippin 06/16/2022 [phone]

## 2022-06-16 NOTE — Progress Notes (Signed)
Daily Post Partum Note  06/16/2022 Sonya Tapia is a 31 y.o. [id number]   POD#8 s/p repeat c/s 2/2 uterine rupture @ [redacted]w[redacted]d .   24hr/overnight events:  Pt continued IV antibiotics  Subjective:  Pt seen this morning.  She still notes some mild/moderate abdominal pain around her umbilicus.  She denies fever/chills.  The patient is tolerating regular diet and pain is well controlled.  Objective:   Vitals:   06/15/22 2334 06/16/22 0510 06/16/22 0813 06/16/22 1147  BP: 120/74 125/78 133/80 (!) 143/96  Pulse: (!) 105 87 83 79  Resp: 16 16 16 16   Temp: 99.9 F (37.7 C) 98.1 F (36.7 C) 98.5 F (36.9 C) 98.4 F (36.9 C)  TempSrc: Oral Oral Oral Oral  SpO2: 100% 98% 98% 96%  Height:        Temp:  [98.1 F (36.7 C)-99.9 F (37.7 C)] 98.4 F (36.9 C) (07/31 1147) Pulse Rate:  [79-105] 79 (07/31 1147) Resp:  [16-18] 16 (07/31 1147) BP: (117-143)/(68-96) 143/96 (07/31 1147) SpO2:  [96 %-100 %] 96 % (07/31 1147) I/O last 3 completed shifts: In: 720 [P.O.:720] Out: 3200 [Urine:3200] Total I/O In: -  Out: 900 [Urine:900]  Intake/Output Summary (Last 24 hours) at 06/16/2022 1407 Last data filed at 06/16/2022 0824 Gross per 24 hour  Intake 720 ml  Output 2500 ml  Net -1780 ml     Current Vital Signs 24h Vital Sign Ranges  T 98.4 F (36.9 C) Temp  Avg: 98.6 F (37 C)  Min: 98.1 F (36.7 C)  Max: 99.9 F (37.7 C)  BP (!) 143/96 (RN notified) BP  Min: 117/73  Max: 143/96  HR 79 Pulse  Avg: 91.3  Min: 79  Max: 105  RR 16 Resp  Avg: 16.5  Min: 16  Max: 18  SaO2 96 %  (room air) SpO2  Avg: 98.5 %  Min: 96 %  Max: 100 %       24 Hour I/O Current Shift I/O  Time Ins Outs 07/30 0701 - 07/31 0700 In: 720 [P.O.:720] Out: 3200 [Urine:3200] 07/31 0701 - 07/31 1900 In: -  Out: 900 [Urine:900]    General: NAD Abdomen: midly tender, nondistended.  Perineum: n/a Skin:  Warm and dry.  Cardiovascular: regular rate and rhythm Respiratory:  Clear to auscultation bilateral. Normal  respiratory effort Extremities: no calf pain bilaterally  Medications Current Facility-Administered Medications  Medication Dose Route Frequency Provider Last Rate Last Admin   acetaminophen (TYLENOL) tablet 650 mg  650 mg Oral Q6H PRN 8/31, MD   650 mg at 06/14/22 2045   amoxicillin-clavulanate (AUGMENTIN) 875-125 MG per tablet 1 tablet  1 tablet Oral Q12H 2046, MD       doxycycline (VIBRA-TABS) tablet 100 mg  100 mg Oral Q12H [place] Fillers, MD       enoxaparin (LOVENOX) injection 40 mg  40 mg Subcutaneous Q24H [place] Fillers, MD   40 mg at 06/15/22 1956   folic acid (FOLVITE) tablet 1 mg  1 mg Oral Daily 06/17/22, MD   1 mg at 06/16/22 06/18/22   furosemide (LASIX) tablet 20 mg  20 mg Oral BID 3614, MD   20 mg at 06/16/22 06/18/22   gabapentin (NEURONTIN) capsule 200 mg  200 mg Oral BID 4315, MD   200 mg at 06/15/22 2205   HYDROmorphone (DILAUDID) injection 0.2-0.6 mg  0.2-0.6 mg Intravenous Q2H PRN 2206, MD       labetalol (NORMODYNE) tablet  300 mg  300 mg Oral BID [place] Bing, MD   300 mg at 06/16/22 0934   ondansetron (ZOFRAN) tablet 4 mg  4 mg Oral Q6H PRN [place] Bing, MD       Or   ondansetron (ZOFRAN) injection 4 mg  4 mg Intravenous Q6H PRN [place] Bing, MD       oxyCODONE (Oxy IR/ROXICODONE) immediate release tablet 5-10 mg  5-10 mg Oral Q4H PRN [place] Bing, MD   5 mg at 06/15/22 1023   pantoprazole (PROTONIX) EC tablet 40 mg  40 mg Oral Daily [place] Bing, MD   40 mg at 06/16/22 0933   polyethylene glycol (MIRALAX / GLYCOLAX) packet 17 g  17 g Oral Daily [place] Bing, MD   17 g at 06/16/22 0931   senna (SENOKOT) tablet 8.6 mg  1 tablet Oral QHS PRN [place] Bing, MD       simethicone (MYLICON) chewable tablet 80 mg  80 mg Oral QID PRN [place] Bing, MD   80 mg at 06/14/22 0830    Labs:  Recent Labs  Lab 06/14/22 0417 06/15/22 0404 06/16/22 0517  WBC 15.6* 11.3* 9.7  HGB 7.9*  7.7* 8.1*  HCT 22.5* 21.4* 22.9*  PLT 112* 116* 158   Recent Labs  Lab 06/14/22 0417 06/15/22 0404 06/16/22 0517  NA 136 134* 136  K 4.0 3.5 3.9  CL 107 104 106  CO2 23 22 24   BUN 7 7 7   CREATININE 1.13* 1.16* 1.16*  GLUCOSE 99 91 111*  CALCIUM 8.4* 8.1* 8.5*    Assessment & Plan:  Post op readmit s/p IOL/TOLAC and uterine rupture *Postpartum/postop: POD 8 * ID: spoke with new consultant, agreed could discontinue IV abx and convert to oral abx with doxycycline and augmentin.  Pt acknowledges she will not be breast feeding. Cr. is stable, will still consult renal prior to discharge *Dispo: anticipate discharge in AM 06/17/22 with 2 weeks of oral abx.   Outpatient follow up with OB, per ID no need for outpatient follow up with them unless she continues to have issues.  , MD Attending Center for [facility])

## 2022-06-16 NOTE — Consult Note (Signed)
Reason for Consult: Renal failure Referring Physician:  Dr. Mariel Aloe  Chief Complaint:  Endometritis  Assessment/Plan: Acute kidney injury with BL Cr 0.8-0.9 in the setting of recent emergent c-section for rupture + subsequent endometritis but no active bleed. D/w primary and urology who felt that the hydroureter and hydronephrosis may still be consistent with physiologic changes with pregnancy. AKI likely secondary to cardiovascular effects from transient hypotension + inflammation. Fortunately renal function is relatively stable. - She will need follow up with [facility] in 2-4 weeks; will let the office know tomorrow to reach out to her. - Hopefully she doesn't have any worsening renal function with the contrast; will f/u tomorrow and if no worsening of renal function CIN will be less likely.  -Monitor Daily I/Os, Daily weight  -Maintain MAP>65 for optimal renal perfusion.  -Avoid nephrotoxic medications including NSAIDs  S/p emergent c-section @ 37 weeks and appears to be doing well. Endometritis treated with IV Abx initially and transitioning to PO SCD - stable and no evidence of flare.   HPI: Sonya Tapia is an 31 y.o. female  SCD, thrombocytopenia, h/o DVT on Lovenox, POD#4 after an emergent c-section for uterine rupture at 37 weeks and discharged yesterday but then presenting with left sided abdominal pain and CT showed a left sided blush of contrast in the endometrial area + large hematoma involving the left rectus muscle and obliques with fevers of 102.  Started on Zosyn.  Of note the CT with contrast ( ) also showed marked hydronephrosis in the RK with significant hydroureter down to the pelvis where the ureter is likely compressed by the enlarged uterus. The bladder is unremarkable.   ROS Pertinent items are noted in HPI.  Chemistry and CBC: Creatinine, Ser  Date/Time Value Ref Range Status  06/16/2022 05:17 AM 1.16 (H) 0.44 - 1.00 mg/dL Final  79/39/0300 92:33 AM 1.16 (H)  0.44 - 1.00 mg/dL Final  00/76/2263 33:54 AM 1.13 (H) 0.44 - 1.00 mg/dL Final  56/25/6389 37:34 AM 1.19 (H) 0.44 - 1.00 mg/dL Final  28/76/8115 72:62 AM 0.93 0.44 - 1.00 mg/dL Final  03/55/9741 63:84 AM 0.93 0.44 - 1.00 mg/dL Final  53/64/6803 21:22 PM 0.86 0.44 - 1.00 mg/dL Final   Recent Labs  Lab 06/12/22 0950 06/13/22 0424 06/14/22 0417 06/15/22 0404 06/16/22 0517  NA 139 136 136 134* 136  K 3.9 3.8 4.0 3.5 3.9  CL 108 106 107 104 106  CO2 23 23 23 22 24   GLUCOSE 100* 102* 99 91 111*  BUN 7 11 7 7 7   CREATININE 0.93 1.19* 1.13* 1.16* 1.16*  CALCIUM 8.8* 8.3* 8.4* 8.1* 8.5*   Recent Labs  Lab 06/13/22 0424 06/14/22 0417 06/15/22 0404 06/16/22 0517  WBC 15.8* 15.6* 11.3* 9.7  NEUTROABS 13.0* 12.8* 8.9* 7.4  HGB 8.4* 7.9* 7.7* 8.1*  HCT 23.6* 22.5* 21.4* 22.9*  MCV 80.0 80.1 77.8* 79.0*  PLT 102* 112* 116* 158   Liver Function Tests: Recent Labs  Lab 06/14/22 0417 06/15/22 0404 06/16/22 0517  AST 15 12* 11*  ALT 10 9 9   ALKPHOS 76 74 72  BILITOT 1.6* 1.0 1.0  PROT 5.2* 5.4* 5.7*  ALBUMIN 2.1* 2.1* 2.2*   Recent Labs  Lab 06/12/22 0950  LIPASE 29   No results for input(s): "AMMONIA" in the last 168 hours. Cardiac Enzymes: No results for input(s): "CKTOTAL", "CKMB", "CKMBINDEX", "TROPONINI" in the last 168 hours. Iron Studies: No results for input(s): "IRON", "TIBC", "TRANSFERRIN", "FERRITIN" in the last 72 hours. PT/INR: @  LABRCNTIP(inr:5)  Xrays/Other Studies: ) Results for orders placed or performed during the hospital encounter of 06/12/22 (from the past 48 hour(s))  Comprehensive metabolic panel     Status: Abnormal   Collection Time: 06/15/22  4:04 AM  Result Value Ref Range   Sodium 134 (L) 135 - 145 mmol/L   Potassium 3.5 3.5 - 5.1 mmol/L   Chloride 104 98 - 111 mmol/L   CO2 22 22 - 32 mmol/L   Glucose, Bld 91 70 - 99 mg/dL    Comment: Glucose reference range applies only to samples taken after fasting for at least 8 hours.   BUN 7 6 - 20  mg/dL   Creatinine, Ser 1.61 (H) 0.44 - 1.00 mg/dL   Calcium 8.1 (L) 8.9 - 10.3 mg/dL   Total Protein 5.4 (L) 6.5 - 8.1 g/dL   Albumin 2.1 (L) 3.5 - 5.0 g/dL   AST 12 (L) 15 - 41 U/L   ALT 9 0 - 44 U/L   Alkaline Phosphatase 74 38 - 126 U/L   Total Bilirubin 1.0 0.3 - 1.2 mg/dL   GFR, Estimated >09 >60 mL/min    Comment: (NOTE) Calculated using the CKD-EPI Creatinine Equation (2021)    Anion gap 8 5 - 15    Comment: Performed at [facility], [address], [place], [place] [address]  CBC with Differential/Platelet     Status: Abnormal   Collection Time: 06/15/22  4:04 AM  Result Value Ref Range   WBC 11.3 (H) 4.0 - 10.5 K/uL   RBC 2.75 (L) 3.87 - 5.11 MIL/uL   Hemoglobin 7.7 (L) 12.0 - 15.0 g/dL    Comment: Reticulocyte Hemoglobin testing may be clinically indicated, consider ordering this additional test [id number]    HCT 21.4 (L) 36.0 - 46.0 %   MCV 77.8 (L) 80.0 - 100.0 fL   MCH 28.0 26.0 - 34.0 pg   MCHC 36.0 30.0 - 36.0 g/dL   RDW 29.5 (H) 62.1 - 30.8 %   Platelets 116 (L) 150 - 400 K/uL    Comment: REPEATED TO VERIFY   nRBC 0.0 0.0 - 0.2 %   Neutrophils Relative % 78 %   Neutro Abs 8.9 (H) 1.7 - 7.7 K/uL   Lymphocytes Relative 13 %   Lymphs Abs 1.5 0.7 - 4.0 K/uL   Monocytes Relative 7 %   Monocytes Absolute 0.8 0.1 - 1.0 K/uL   Eosinophils Relative 1 %   Eosinophils Absolute 0.1 0.0 - 0.5 K/uL   Basophils Relative 0 %   Basophils Absolute 0.0 0.0 - 0.1 K/uL   Immature Granulocytes 1 %   Abs Immature Granulocytes 0.08 (H) 0.00 - 0.07 K/uL    Comment: Performed at [facility], [address] [place], [place] [address]  HIV Antibody (routine testing w rflx)     Status: None   Collection Time: 06/15/22  4:04 AM  Result Value Ref Range   HIV Screen 4th Generation wRfx Non Reactive Non Reactive    Comment: Performed at [facility], [address], [place], [place] [address]  Comprehensive metabolic panel     Status: Abnormal   Collection  Time: 06/16/22  5:17 AM  Result Value Ref Range   Sodium 136 135 - 145 mmol/L   Potassium 3.9 3.5 - 5.1 mmol/L   Chloride 106 98 - 111 mmol/L   CO2 24 22 - 32 mmol/L   Glucose, Bld 111 (H) 70 - 99 mg/dL  Comment: Glucose reference range applies only to samples taken after fasting for at least 8 hours.   BUN 7 6 - 20 mg/dL   Creatinine, Ser 0.86 (H) 0.44 - 1.00 mg/dL   Calcium 8.5 (L) 8.9 - 10.3 mg/dL   Total Protein 5.7 (L) 6.5 - 8.1 g/dL   Albumin 2.2 (L) 3.5 - 5.0 g/dL   AST 11 (L) 15 - 41 U/L   ALT 9 0 - 44 U/L   Alkaline Phosphatase 72 38 - 126 U/L   Total Bilirubin 1.0 0.3 - 1.2 mg/dL   GFR, Estimated >57 >84 mL/min    Comment: (NOTE) Calculated using the CKD-EPI Creatinine Equation (2021)    Anion gap 6 5 - 15    Comment: Performed at [facility], [address], [place], [place] [address]  CBC with Differential/Platelet     Status: Abnormal   Collection Time: 06/16/22  5:17 AM  Result Value Ref Range   WBC 9.7 4.0 - 10.5 K/uL   RBC 2.90 (L) 3.87 - 5.11 MIL/uL   Hemoglobin 8.1 (L) 12.0 - 15.0 g/dL   HCT 52.8 (L) 41.3 - 24.4 %   MCV 79.0 (L) 80.0 - 100.0 fL   MCH 27.9 26.0 - 34.0 pg   MCHC 35.4 30.0 - 36.0 g/dL   RDW 01.0 (H) 27.2 - 53.6 %   Platelets 158 150 - 400 K/uL    Comment: REPEATED TO VERIFY   nRBC 0.0 0.0 - 0.2 %   Neutrophils Relative % 77 %   Neutro Abs 7.4 1.7 - 7.7 K/uL   Lymphocytes Relative 14 %   Lymphs Abs 1.4 0.7 - 4.0 K/uL   Monocytes Relative 7 %   Monocytes Absolute 0.7 0.1 - 1.0 K/uL   Eosinophils Relative 1 %   Eosinophils Absolute 0.1 0.0 - 0.5 K/uL   Basophils Relative 0 %   Basophils Absolute 0.0 0.0 - 0.1 K/uL   Immature Granulocytes 1 %   Abs Immature Granulocytes 0.07 0.00 - 0.07 K/uL    Comment: Performed at [facility], [address], [place], [place] [address]   No results found.  PMH:   Past Medical History:  Diagnosis Date   DVT (deep venous thrombosis) (HCC)    Pregnancy induced hypertension    Sickle  cell anemia (HCC)     PSH:   Past Surgical History:  Procedure Laterality Date   CESAREAN SECTION     CESAREAN SECTION N/A 06/07/2022   Procedure: CESAREAN SECTION;  Surgeon: Tereso Newcomer, MD;  Location: [facility] LD ORS;  Service: Obstetrics;  Laterality: N/A;    Allergies: No Known Allergies  Medications:   Prior to Admission medications   Medication Sig Start Date End Date Taking? Authorizing Provider  enoxaparin (LOVENOX) 40 MG/0.4ML injection Inject 1 syringe (40 mg total) into the skin daily. 06/10/22 07/22/22 Yes [place] Bing, MD  ergocalciferol (VITAMIN D2) 1.25 MG (50000 UT) capsule Take 50,000 Units by mouth once a week.   Yes [provider]  ferrous sulfate (FERROUSUL) 325 (65 FE) MG tablet Take 1 tablet (325 mg total) by mouth every other day. 02/07/22  Yes Constant, Peggy, MD  FOLIC ACID PO Take 1 capsule by mouth daily.   Yes [provider]  furosemide (LASIX) 20 MG tablet Take 1 tablet (20 mg total) by mouth 2 (two) times daily for 5 days. 06/12/22 06/17/22 Yes Eber Hong, MD  labetalol (NORMODYNE) 300 MG tablet Take 1 tablet (300  mg total) by mouth 2 (two) times daily. 06/12/22 07/12/22 Yes Eber Hong, MD  oxyCODONE-acetaminophen (PERCOCET/ROXICET) 5-325 MG tablet Take 1 tablet by mouth every 6 (six) hours as needed. 06/10/22  Yes [place] Bing, MD  Prenatal Vit-Fe Fumarate-FA (MULTIVITAMIN-PRENATAL) 27-0.8 MG TABS tablet Take 1 tablet by mouth daily at 12 noon.   Yes [provider]  senna-docusate (SENOKOT-S) 8.6-50 MG tablet Take 2 tablets by mouth at bedtime as needed for mild constipation. 06/10/22  Yes [place] Bing, MD  simethicone (MYLICON) 80 MG chewable tablet Chew 1 tablet (80 mg total) by mouth 4 (four) times daily as needed for flatulence. 06/10/22   [place] Bing, MD    Discontinued Meds:   Medications Discontinued During This Encounter  Medication Reason   cefTRIAXone (ROCEPHIN) 2 g in sodium chloride 0.9 % 100 mL IVPB     metroNIDAZOLE (FLAGYL) IVPB 500 mg    lactated ringers infusion    enoxaparin (LOVENOX) injection 45 mg    simethicone (MYLICON) chewable tablet 80 mg    lactated ringers infusion    piperacillin-tazobactam (ZOSYN) IVPB 3.375 g    vancomycin (VANCOREADY) IVPB 1250 mg/250 mL     Social History:  reports that she has never smoked. She has never used smokeless tobacco. She reports that she does not currently use alcohol. She reports that she does not use drugs.  Family History:   Family History  Problem Relation Age of Onset   Hypertension Mother    Healthy Father     Blood pressure (!) 143/96, pulse 79, temperature 98.4 F (36.9 C), temperature source Oral, resp. rate 16, height 5\' 4"  (1.626 m), last menstrual period 09/17/2021, SpO2 96 %, unknown if currently breastfeeding. General appearance: alert, cooperative, and appears stated age Head: Normocephalic, without obvious abnormality, atraumatic Eyes: negative Neck: no adenopathy, no carotid bruit, no JVD, supple, symmetrical, trachea midline, and thyroid not enlarged, symmetric, no tenderness/mass/nodules Back: symmetric, no curvature. ROM normal. No CVA tenderness. Resp: clear to auscultation bilaterally Cardio: regular rate and rhythm GI: minimal tenderness on left lower but no rebound Extremities: extremities normal, atraumatic, no cyanosis or edema Pulses: 2+ and symmetric Skin: Skin color, texture, turgor normal. No rashes or lesions       13/11/2020, MD 06/16/2022, 4:57 PM

## 2022-06-16 NOTE — Addendum Note (Signed)
Addendum  created 06/16/22 0932 by Elmer Picker, MD   Clinical Note Signed, Intraprocedure Blocks edited, Intraprocedure Event edited, Intraprocedure Staff edited

## 2022-06-17 ENCOUNTER — Other Ambulatory Visit (HOSPITAL_COMMUNITY): Payer: Self-pay

## 2022-06-17 DIAGNOSIS — S301XXD Contusion of abdominal wall, subsequent encounter: Secondary | ICD-10-CM

## 2022-06-17 LAB — URINALYSIS, ROUTINE W REFLEX MICROSCOPIC
Glucose, UA: NEGATIVE mg/dL
Ketones, ur: NEGATIVE mg/dL
Nitrite: POSITIVE — AB
Protein, ur: >300 mg/dL — AB
Specific Gravity, Urine: 1.015 (ref 1.005–1.030)
pH: 6.5 (ref 5.0–8.0)

## 2022-06-17 LAB — CULTURE, BLOOD (ROUTINE X 2)
Culture: NO GROWTH
Culture: NO GROWTH
Special Requests: ADEQUATE
Special Requests: ADEQUATE

## 2022-06-17 LAB — URINALYSIS, MICROSCOPIC (REFLEX)
RBC / HPF: >50 RBC/hpf (ref 0–5)
WBC, UA: >50 WBC/hpf (ref 0–5)

## 2022-06-17 LAB — BASIC METABOLIC PANEL
Anion gap: 8 (ref 5–15)
BUN: <5 mg/dL — ABNORMAL LOW (ref 6–20)
CO2: 23 mmol/L (ref 22–32)
Calcium: 8.6 mg/dL — ABNORMAL LOW (ref 8.9–10.3)
Chloride: 104 mmol/L (ref 98–111)
Creatinine, Ser: 1.1 mg/dL — ABNORMAL HIGH (ref 0.44–1.00)
GFR, Estimated: >60 mL/min (ref >60)
Glucose, Bld: 98 mg/dL (ref 70–99)
Potassium: 3.7 mmol/L (ref 3.5–5.1)
Sodium: 135 mmol/L (ref 135–145)

## 2022-06-17 LAB — PROTEIN / CREATININE RATIO, URINE
Creatinine, Urine: 98 mg/dL
Protein Creatinine Ratio: 2.3 mg/mg{Cre} — ABNORMAL HIGH (ref 0.00–0.15)
Total Protein, Urine: 225 mg/dL

## 2022-06-17 MED ORDER — AMOXICILLIN-POT CLAVULANATE 875-125 MG PO TABS
1.0000 | ORAL_TABLET | Freq: Two times a day (BID) | ORAL | 0 refills | Status: AC
  Filled 2022-06-17: qty 26, 13d supply, fill #0

## 2022-06-17 MED ORDER — DOXYCYCLINE HYCLATE 100 MG PO TABS
100.0000 mg | ORAL_TABLET | Freq: Two times a day (BID) | ORAL | 0 refills | Status: AC
  Filled 2022-06-17: qty 26, 13d supply, fill #0

## 2022-06-17 NOTE — Discharge Summary (Signed)
Physician Discharge Summary  Patient ID: Sonya Tapia MRN: [id number] DOB/AGE: [birth year] 31 y.o.  Admit date: 06/12/2022 Discharge date: 06/17/2022  Admission Diagnoses:  Discharge Diagnoses:  Principal Problem:   Postpartum endometritis Active Problems:   Rectus sheath hematoma   Sepsis without acute organ dysfunction (HCC)   Hydronephrosis with ureteropelvic junction (UPJ) obstruction   FUO (fever of unknown origin)   Discharged Condition: good  Hospital Course: Pt readmitted with left sided belly pain on POD 4.  CT scan of abdomen showed rectus sheath hematoma.  Pt had continued episodes of fever.  She was started on IV abx with zosyn.  Pt continued to have breakthrough fevers.  ID was consulted and vancomycin was added.  Pt had no further febrile episodes and was converted to po augmentin and doxycycline for 2 weeks per ID.  Pt was discharged home to continue oral abx therapy.   Consults: ID, nephrology, and urology  Significant Diagnostic Studies: radiology: CXR: normal and CT scan: rectus sheath hematoma, right hydronephrosis  Treatments: antibiotics: vancomycin, Zosyn, and converted to augmentin and doxycycline  Discharge Exam: Blood pressure 136/86, pulse 93, temperature 99.2 F (37.3 C), temperature source Oral, resp. rate 15, height 5\' 4"  (1.626 m), last menstrual period 09/17/2021, SpO2 96 %, unknown if currently breastfeeding. General appearance: alert, cooperative, and no distress Head: Normocephalic, without obvious abnormality, atraumatic Resp: clear to auscultation bilaterally Extremities: extremities normal, atraumatic, no cyanosis or edema and Homans sign is negative, no sign of DVT Incision/Wound:Abdomen:  NT/ND soft, firm area noted at umbilicus, likely extension of hematoma.  Disposition: Discharge disposition: 01-Home or Self Care       Discharge Instructions     Activity as tolerated   Complete by: As directed    Call MD for:  persistant  dizziness or light-headedness   Complete by: As directed    Call MD for:  persistant nausea and vomiting   Complete by: As directed    Call MD for:  redness, tenderness, or signs of infection (pain, swelling, redness, odor or green/yellow discharge around incision site)   Complete by: As directed    Call MD for:  severe uncontrolled pain   Complete by: As directed    Call MD for:  temperature >100.4   Complete by: As directed    Diet - low sodium heart healthy   Complete by: As directed    Driving restriction    Complete by: As directed    Avoid driving for at least 1 weeks or if taking narcotic pain medication   Lifting restrictions   Complete by: As directed    Weight restriction of 25 lbs.      Allergies as of 06/17/2022   No Known Allergies      Medication List     TAKE these medications    amoxicillin-clavulanate 875-125 MG tablet Commonly known as: AUGMENTIN Take 1 tablet by mouth every 12 (twelve) hours for 13 days.   doxycycline 100 MG tablet Commonly known as: VIBRA-TABS Take 1 tablet (100 mg total) by mouth every 12 (twelve) hours for 13 days.   enoxaparin 40 MG/0.4ML injection Commonly known as: LOVENOX Inject 1 syringe (40 mg total) into the skin daily.   ergocalciferol 1.25 MG (50000 UT) capsule Commonly known as: VITAMIN D2 Take 50,000 Units by mouth once a week.   ferrous sulfate 325 (65 FE) MG tablet Commonly known as: FerrouSul Take 1 tablet (325 mg total) by mouth every other day.   FOLIC ACID PO Take  1 capsule by mouth daily.   labetalol 300 MG tablet Commonly known as: NORMODYNE Take 300 mg by mouth 2 (two) times daily.   multivitamin-prenatal 27-0.8 MG Tabs tablet Take 1 tablet by mouth daily at 12 noon.   oxyCODONE-acetaminophen 5-325 MG tablet Commonly known as: PERCOCET/ROXICET Take 1 tablet by mouth every 6 (six) hours as needed.   Senexon-S 8.6-50 MG tablet Generic drug: senna-docusate Take 2 tablets by mouth at bedtime as  needed for mild constipation.   V-R GAS RELIEF 80 MG chewable tablet Generic drug: simethicone Chew 1 tablet (80 mg total) by mouth 4 (four) times daily as needed for flatulence.        Follow-up Information     [facility] EMERGENCY DEPARTMENT .   Specialty: Emergency Medicine Why: If symptoms worsen Contact information: [address] [id number] m[address] [place] [address] [phone]        [facility] Follow up.   Why: the office will call to arrange follow up with a renal Ultrasound in 2-3 weeks. Contact information: [address] [place] [address] [place] [address] [phone]        [facility] Follow up in 2 week(s).   Specialty: Obstetrics and Gynecology Why: hospital follow up, MD visit Contact information: [address] [place] [phone]                Signed: Warden Fillers 06/17/2022, 5:42 PM

## 2022-06-17 NOTE — Progress Notes (Signed)
[facility] Progress Note   31 y.o. female  SCD, thrombocytopenia, h/o DVT on Lovenox, POD#4 after an emergent c-section for uterine rupture at 37 weeks and discharged yesterday but then presenting with left sided abdominal pain and CT showed a left sided blush of contrast in the endometrial area + large hematoma involving the left rectus muscle and obliques with fevers of 102.   Of note the CT with contrast ( ) also showed marked hydronephrosis in the RK with significant hydroureter down to the pelvis where the ureter is likely compressed by the enlarged uterus. The bladder is unremarkable.     Assessment/ Plan:   Acute kidney injury with BL Cr 0.8-0.9 in the setting of recent emergent c-section for rupture + subsequent endometritis but no active bleed. D/w primary and urology who felt that the hydroureter and hydronephrosis may still be consistent with physiologic changes with pregnancy. AKI likely secondary to cardiovascular effects from transient hypotension + inflammation. Fortunately renal function is relatively stable. - She will need follow up with [facility] in 2-4 weeks; I've already called the office today and they will reach out to her. - She doesn't have any worsening renal function with the contrast and CIN will be less likely. From renal standpoint ok for d/c.    S/p emergent c-section @ 37 weeks and appears to be doing well. Endometritis treated with IV Abx initially and transitioning to PO SCD - stable and no evidence of flare.  Subjective:   Feeling well and denies and LLQ pain anymore or fevers. Mild discomfort in umbilicus   Objective:   BP 136/86 (BP Location: Left Arm)   Pulse 93   Temp 99.2 F (37.3 C) (Oral)   Resp 15   Ht 5\' 4"  (1.626 m)   LMP 09/17/2021   SpO2 96%   BMI 34.33 kg/m   Intake/Output Summary (Last 24 hours) at 06/17/2022 1247 Last data filed at 06/17/2022 08/17/2022 Gross per 24 hour  Intake --  Output 2150 ml  Net -2150 ml   Weight  change:   Physical Exam: GEN: NAD, A&Ox3, NCAT HEENT: No conjunctival pallor, EOMI NECK: Supple, no thyromegaly LUNGS: CTA B/L no rales, rhonchi or wheezing CV: RRR, No M/R/G ABD: +BS, no rebound or guarding EXT: Tr lower extremity edema    Imaging: No results found.  Labs: BMET Recent Labs  Lab 06/12/22 0950 06/13/22 0424 06/14/22 0417 06/15/22 0404 06/16/22 0517 06/17/22 0756  NA 139 136 136 134* 136 135  K 3.9 3.8 4.0 3.5 3.9 3.7  CL 108 106 107 104 106 104  CO2 23 23 23 22 24 23   GLUCOSE 100* 102* 99 91 111* 98  BUN 7 11 7 7 7  <5*  CREATININE 0.93 08/17/22* 1.13* 1.16* 1.16* 1.10*  CALCIUM 8.8* 8.3* 8.4* 8.1* 8.5* 8.6*   CBC Recent Labs  Lab 06/13/22 0424 06/14/22 0417 06/15/22 0404 06/16/22 0517  WBC 15.8* 15.6* 11.3* 9.7  NEUTROABS 13.0* 12.8* 8.9* 7.4  HGB 8.4* 7.9* 7.7* 8.1*  HCT 23.6* 22.5* 21.4* 22.9*  MCV 80.0 80.1 77.8* 79.0*  PLT 102* 112* 116* 158    Medications:     amoxicillin-clavulanate  1 tablet Oral Q12H   doxycycline  100 mg Oral Q12H   enoxaparin (LOVENOX) injection  40 mg Subcutaneous Q24H   folic acid  1 mg Oral Daily   furosemide  20 mg Oral BID   gabapentin  200 mg Oral BID   labetalol  300 mg Oral BID  pantoprazole  40 mg Oral Daily   polyethylene glycol  17 g Oral Daily      Paulene Floor, MD 06/17/2022, 12:47 PM

## 2022-06-18 ENCOUNTER — Encounter: Payer: Medicaid Other | Admitting: Family Medicine

## 2022-06-19 ENCOUNTER — Ambulatory Visit: Payer: Medicaid Other

## 2022-06-19 ENCOUNTER — Other Ambulatory Visit: Payer: Medicaid Other

## 2022-06-24 ENCOUNTER — Encounter: Payer: Medicaid Other | Admitting: Family Medicine

## 2022-06-25 ENCOUNTER — Other Ambulatory Visit: Payer: Self-pay

## 2022-06-25 ENCOUNTER — Ambulatory Visit (INDEPENDENT_AMBULATORY_CARE_PROVIDER_SITE_OTHER): Payer: Medicaid Other

## 2022-06-25 VITALS — BP 116/86 | HR 72 | Wt 167.6 lb

## 2022-06-25 DIAGNOSIS — Z5189 Encounter for other specified aftercare: Secondary | ICD-10-CM

## 2022-06-25 DIAGNOSIS — Z0130 Encounter for examination of blood pressure without abnormal findings: Secondary | ICD-10-CM

## 2022-06-25 NOTE — Progress Notes (Signed)
Blood Pressure & Incision Check Visit  Sonya Tapia is here for blood pressure and incision check following repeat c-section on 06/07/22. BP today is 116/86. Incision is clean, dry, and intact with raised area to right side of incision that appears dry and flaky. [place], MD to bedside who states this is likely a keloid scar and pt will follow up at [facility] visit. May consider referral to plastic surgery for repair at that time. Pt reports pain is well controlled. Reviewed good wound care and s/s of infection.   Marjo Bicker, RN 06/25/2022  8:53 AM

## 2022-06-26 ENCOUNTER — Other Ambulatory Visit: Payer: Medicaid Other

## 2022-06-26 ENCOUNTER — Ambulatory Visit: Payer: Medicaid Other

## 2022-07-01 ENCOUNTER — Encounter: Payer: Medicaid Other | Admitting: Medical

## 2022-07-02 ENCOUNTER — Other Ambulatory Visit: Payer: Medicaid Other

## 2022-07-04 ENCOUNTER — Ambulatory Visit: Payer: Medicaid Other | Admitting: Infectious Diseases

## 2022-07-16 ENCOUNTER — Ambulatory Visit (INDEPENDENT_AMBULATORY_CARE_PROVIDER_SITE_OTHER): Payer: Medicaid Other | Admitting: Infectious Diseases

## 2022-07-16 ENCOUNTER — Encounter: Payer: Self-pay | Admitting: Infectious Diseases

## 2022-07-16 ENCOUNTER — Other Ambulatory Visit: Payer: Self-pay

## 2022-07-16 VITALS — BP 125/87 | HR 75 | Resp 16 | Ht 64.0 in | Wt 167.0 lb

## 2022-07-16 DIAGNOSIS — S301XXD Contusion of abdominal wall, subsequent encounter: Secondary | ICD-10-CM | POA: Diagnosis present

## 2022-07-16 DIAGNOSIS — Z5181 Encounter for therapeutic drug level monitoring: Secondary | ICD-10-CM | POA: Diagnosis not present

## 2022-07-16 DIAGNOSIS — O8612 Endometritis following delivery: Secondary | ICD-10-CM | POA: Diagnosis not present

## 2022-07-16 NOTE — Progress Notes (Addendum)
Patient Active Problem List   Diagnosis Date Noted   Sepsis without acute organ dysfunction (HCC)    Hydronephrosis with ureteropelvic junction (UPJ) obstruction    FUO (fever of unknown origin)    AKI (acute kidney injury) (HCC) 06/12/2022   Rectus sheath hematoma 06/12/2022   Postpartum endometritis 06/12/2022   Thrombocytopenia (HCC) 06/11/2022   Gestational hypertension 06/07/2022   S/P cesarean section 06/07/2022   Uterine rupture 06/07/2022   [place] disease (HCC) 03/20/2022   Alpha thalassemia silent carrier 03/20/2022   History of DVT (deep vein thrombosis) 03/20/2022   Previous cesarean section 02/06/2022   Supervision of high risk pregnancy, antepartum 01/23/2022   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 16.8 mL 0   ergocalciferol (VITAMIN D2) 1.25 MG (50000 UT) capsule Take 50,000 Units by mouth once a week.     ferrous sulfate (FERROUSUL) 325 (65 FE) MG tablet Take 1 tablet (325 mg total) by mouth every other day. 60 tablet 1   FOLIC ACID PO Take 1 capsule by mouth daily.     labetalol (NORMODYNE) 300 MG tablet Take 300 mg by mouth 2 (two) times daily.     senna-docusate (SENOKOT-S) 8.6-50 MG tablet Take 2 tablets by mouth at bedtime as needed for mild constipation. 10 tablet 0   simethicone (MYLICON) 80 MG chewable tablet Chew 1 tablet (80 mg total) by mouth 4 (four) times daily as needed for flatulence. 30 tablet 0   No current facility-administered medications on file prior to visit.    Subjective: 31 Y O female with PMH of thrombocytopenia, estrogen induced DVT, sickle cell disease G2 P2002 status post emergency C-section 7/22 for uterine rupture after IOL GHTN at 37 weeks who is here for  HFU for rectum sheath hematoma/PP  endometritis. She was discharged on 8/1 with 13 days course of PO doxycycline and augmentin. She has completed doxycycline and augmentin as instructed, Denies missing doses. Lower abdominal incision had healed and no concerns. Has a post partum  appt with OB 9/5. Denies fevers, chills. Denies nausea, vomiting, abdominal pain, diarrhea or GU symptoms. She has no complaints today.   Review of Systems: all systems reviewed with pertinent positives and negatives as listed above  Past Medical History:  Diagnosis Date   DVT (deep venous thrombosis) (HCC)    Pregnancy induced hypertension    Sickle cell anemia (HCC)    Past Surgical History:  Procedure Laterality Date   CESAREAN SECTION     CESAREAN SECTION N/A 06/07/2022   Procedure: CESAREAN SECTION;  Surgeon: Tereso Newcomer, MD;  [facility]: [facility];  Service: Obstetrics;  Laterality: N/A;    Social History   Tobacco Use   Smoking status: Never   Smokeless tobacco: Never  Vaping Use   Vaping Use: Never used  Substance Use Topics   Alcohol use: Not Currently    Comment: not while preg   Drug use: Never    Family History  Problem Relation Age of Onset   Hypertension Mother    Healthy Father     No Known Allergies  Health Maintenance  Topic Date Due   COVID-19 Vaccine (1) Never done   INFLUENZA VACCINE  06/17/2022   PAP-Cervical Cytology Screening  06/04/2025  PAP SMEAR-Modifier  06/04/2025   TETANUS/TDAP  04/10/2032   HPV VACCINES  Completed   Hepatitis C Screening  Completed   HIV Screening  Completed    Objective: BP 125/87   Pulse 75   Resp 16   Ht 5\' 4"  (1.626 m)   Wt 167 lb (75.8 kg)   LMP 09/17/2021 Comment: just had childbirth  SpO2 100%   Breastfeeding No   BMI 28.67 kg/m    Physical Exam Constitutional:      Appearance: Normal appearance.  HENT:     Head: Normocephalic and atraumatic.      Mouth: Mucous membranes are moist.  Eyes:    Conjunctiva/sclera: Conjunctivae normal.     Pupils:    Cardiovascular:     Rate and Rhythm: Normal rate and regular rhythm.     Heart sounds:   Pulmonary:     Effort: Pulmonary effort is normal.     Breath sounds: Normal breath sounds.   Abdominal:     General: Non distended     Palpations: soft. Lower abdominal surgical site has healed with no signs of infection   Musculoskeletal:        General: Normal range of motion.   Skin:    General: Skin is warm and dry.     Comments:  Neurological:     General: grossly non focal     Mental Status: awake, alert and oriented to person, place, and time.   Psychiatric:        Mood and Affect: Mood normal.   Lab Results Lab Results  Component Value Date   WBC 9.7 06/16/2022   HGB 8.1 (L) 06/16/2022   HCT 22.9 (L) 06/16/2022   MCV 79.0 (L) 06/16/2022   PLT 158 06/16/2022    Lab Results  Component Value Date   CREATININE 1.10 (H) 06/17/2022   BUN <5 (L) 06/17/2022   NA 135 06/17/2022   K 3.7 06/17/2022   CL 104 06/17/2022   CO2 23 06/17/2022    Lab Results  Component Value Date   ALT 9 06/16/2022   AST 11 (L) 06/16/2022   ALKPHOS 72 06/16/2022   BILITOT 1.0 06/16/2022    No results found for: "CHOL", "HDL", "LDLCALC", "LDLDIRECT", "TRIG", "CHOLHDL" Lab Results  Component Value Date   LABRPR NON REACTIVE 06/04/2022   No results found for: "HIV1RNAQUANT", "HIV1RNAVL", "CD4TABS"   Assessment/Plan #Large left rectus muscle hematoma extending into left oblique abdominal muscles including enlarged postpartum uterus with a small blush of contrast in the upper left endometrial area   # PP endometritis  She has completed recommended course of abtx and has been doing well off antibiotics for more than 2 weeks so far with no concerns  No indication for further abtx or imaging given clinical improvement with no symptoms Fu with OB for post partum care Fu as needed with 06/06/2022   I have personally spent 42  minutes involved in face-to-face and non-face-to-face activities for this  patient on the day of the visit. Professional time spent includes the following activities: Preparing to see the patient (review of tests), Obtaining and/or reviewing separately obtained history (admission/discharge record), Performing a medically appropriate examination and/or evaluation , Ordering medications/tests/procedures, referring and communicating with other health care professionals, Documenting clinical information in the EMR, Independently interpreting results (not separately reported), Communicating results to the patient/family/caregiver, Counseling and educating the patient/family/caregiver and Care coordination (not separately reported).   [place], MD [facility] 07/16/2022, 9:15 AM

## 2022-07-22 ENCOUNTER — Ambulatory Visit: Payer: Medicaid Other | Admitting: Advanced Practice Midwife

## 2022-07-23 NOTE — Progress Notes (Signed)
Post Partum Visit Note  Sonya Tapia is a 31 y.o. G56P2002 female who presents for a postpartum visit. She is 6 weeks postpartum following a repeat cesarean section.  I have fully reviewed the prenatal and intrapartum course. The delivery was at 37.1 gestational weeks.  Anesthesia: epidural. Postpartum course has been complicated by sepsis and extended hospital stay. Baby is doing well. Baby is feeding by bottle - Gerber Gentle . Bleeding no bleeding. Bowel function is normal. Bladder function is normal. Patient is sexually active. Contraception method is none. Postpartum depression screening: negative.   The pregnancy intention screening data noted above was reviewed. Potential methods of contraception were discussed. The patient elected to proceed with No data recorded.   Edinburgh Postnatal Depression Scale - 07/24/22 1323       Edinburgh Postnatal Depression Scale:  In the Past 7 Days   I have been able to laugh and see the funny side of things. 0    I have looked forward with enjoyment to things. 0    I have blamed myself unnecessarily when things went wrong. 0    I have been anxious or worried for no good reason. 0    I have felt scared or panicky for no good reason. 0    Things have been getting on top of me. 0    I have been so unhappy that I have had difficulty sleeping. 0    I have felt sad or miserable. 0    I have been so unhappy that I have been crying. 0    The thought of harming myself has occurred to me. 0    Edinburgh Postnatal Depression Scale Total 0             Health Maintenance Due  Topic Date Due   COVID-19 Vaccine (1) Never done   INFLUENZA VACCINE  Never done    The following portions of the patient's history were reviewed and updated as appropriate: allergies, current medications, past family history, past medical history, past social history, past surgical history, and problem list.  Review of Systems Pertinent items are noted in  HPI.  Objective:  BP (!) 128/95   Pulse 87   Wt 164 lb (74.4 kg)   LMP 09/17/2021 Comment: just had childbirth  Breastfeeding No   BMI 28.15 kg/m    General:  alert, cooperative, and no distress   Breasts:  normal  Lungs: clear to auscultation bilaterally  Heart:  regular rate and rhythm, S1, S2 normal, no murmur, click, rub or gallop  Abdomen: soft, non-tender; bowel sounds normal; no masses,  no organomegaly   Wound well approximated incision  GU exam:  not indicated       Assessment:    Healthy postpartum exam.   Plan:   Essential components of care per ACOG recommendations:  1.  Mood and well being: Patient with negative depression screening today. Reviewed local resources for support.  - Patient tobacco use? No.   - hx of drug use? No.    2. Infant care and feeding:  -Patient currently breastmilk feeding? No.  -Social determinants of health (SDOH) reviewed in EPIC. No concerns 3. Sexuality, contraception and birth spacing - Patient does not want a pregnancy in the next year.  Desired family size is 2 children.  - Reviewed reproductive life planning. Reviewed contraceptive methods based on pt preferences and effectiveness.  Patient desired Oral Contraceptive today.   - Discussed birth spacing of 18 months -patient  does not want Depo, IUD but she is considering Nexplanon. Long discussion with patient about Blood pressure and how she needs to take her BP medicine in order to take her birth control; explained that estrogen-containing contraceptives are not recommened but patient strongly desires not to have any other method and states that she got pregnant on POP. SHe reports that she is motivated to take her BP medicine and is seeing her PCP in October. I explained that her delivery and complications means that her next pregnancy would be high risk and that she would need to be followed closely; she understands and does not desire pregnancy at this time.   4. Sleep and  fatigue -Encouraged family/partner/community support of 4 hrs of uninterrupted sleep to help with mood and fatigue  5. Physical Recovery  - Discussed patients delivery and complications. She describes her labor as mixed. - Patient had a C-section emergent. Patient had a  NA  laceration. Perineal healing reviewed. Patient expressed understanding - Patient has urinary incontinence? No. - Patient is safe to resume physical and sexual activity  6.  Health Maintenance - HM due items addressed No - BP medicine started and she has follow up in October - Last pap smear  Diagnosis  Date Value Ref Range Status  06/04/2022   Final   - Negative for intraepithelial lesion or malignancy (NILM)   Pap smear not done at today's visit.  -Breast Cancer screening indicated? No.   7. Chronic Disease/Pregnancy Condition follow up: Hypertension. SHe has FUP PCP visit in October. RX given for procardia for 1 month plus refill.   - PCP follow up  Marylene Land, CNM [facility], [facility]

## 2022-07-24 ENCOUNTER — Other Ambulatory Visit: Payer: Self-pay

## 2022-07-24 ENCOUNTER — Ambulatory Visit (INDEPENDENT_AMBULATORY_CARE_PROVIDER_SITE_OTHER): Payer: Medicaid Other | Admitting: Student

## 2022-07-24 ENCOUNTER — Encounter: Payer: Self-pay | Admitting: Student

## 2022-07-24 MED ORDER — NIFEDIPINE ER OSMOTIC RELEASE 30 MG PO TB24: 30.0000 mg | ORAL_TABLET | Freq: Every day | ORAL | 1 refills | Status: DC

## 2022-07-24 MED ORDER — NORGESTIMATE-ETH ESTRADIOL 0.25-35 MG-MCG PO TABS: 1.0000 | ORAL_TABLET | Freq: Every day | ORAL | 11 refills | Status: DC

## 2022-08-08 ENCOUNTER — Other Ambulatory Visit: Payer: Self-pay | Admitting: Student

## 2022-12-18 ENCOUNTER — Encounter: Payer: Self-pay | Admitting: General Practice

## 2022-12-18 ENCOUNTER — Ambulatory Visit (INDEPENDENT_AMBULATORY_CARE_PROVIDER_SITE_OTHER): Payer: Medicaid Other | Admitting: General Practice

## 2022-12-18 VITALS — BP 126/86 | HR 87 | Ht 64.0 in | Wt 175.0 lb

## 2022-12-18 DIAGNOSIS — Z3491 Encounter for supervision of normal pregnancy, unspecified, first trimester: Secondary | ICD-10-CM

## 2022-12-18 DIAGNOSIS — Z3201 Encounter for pregnancy test, result positive: Secondary | ICD-10-CM

## 2022-12-18 DIAGNOSIS — Z86718 Personal history of other venous thrombosis and embolism: Secondary | ICD-10-CM

## 2022-12-18 LAB — POCT PREGNANCY, URINE: Preg Test, Ur: POSITIVE — AB

## 2022-12-18 MED ORDER — ENOXAPARIN SODIUM 40 MG/0.4ML IJ SOSY: 40.0000 mg | PREFILLED_SYRINGE | INTRAMUSCULAR | 11 refills | Status: AC

## 2022-12-18 NOTE — Progress Notes (Signed)
Patient came by office and dropped off urine sample for UPT. UPT +.  Called patient at home and she reports first positive home test 2 days ago. She delivered 05/2022 via c-section with complication of GHTN & uterine rupture. She reports one period since having the baby- LMP 08/26/22. She states after her period she started taking OCPs but reports missing about half the pills. Denies symptoms of pregnancy but "just had a feeling" so she took a pregnancy test. History is also significant for DVT & Sickle Cell. Asked patient to come in this afternoon for informal ultrasound to estimate dating. Formal ultrasound scheduled for 2/12 @ 1pm- patient informed. Discussed history with Dr Kennon Rounds who recommends patient starting lovenox 40mg  daily and asap new OB appt. Lovenox Rx sent to pharmacy & patient informed.   Patient came into office for informal bedside ultrasound for dating estimation. [place] [redacted]w[redacted]d. Rescheduled ultrasound with MFM for 2/6 & patient informed.  Koren Bound RN BSN 12/18/22

## 2022-12-19 ENCOUNTER — Encounter: Payer: Self-pay | Admitting: *Deleted

## 2022-12-23 ENCOUNTER — Encounter: Payer: Self-pay | Admitting: *Deleted

## 2022-12-23 ENCOUNTER — Other Ambulatory Visit: Payer: Self-pay | Admitting: *Deleted

## 2022-12-23 ENCOUNTER — Other Ambulatory Visit: Payer: Self-pay | Admitting: Family Medicine

## 2022-12-23 ENCOUNTER — Ambulatory Visit: Payer: Medicaid Other | Attending: Family Medicine

## 2022-12-23 ENCOUNTER — Ambulatory Visit: Payer: Medicaid Other | Admitting: *Deleted

## 2022-12-23 DIAGNOSIS — O99012 Anemia complicating pregnancy, second trimester: Secondary | ICD-10-CM | POA: Insufficient documentation

## 2022-12-23 DIAGNOSIS — O99212 Obesity complicating pregnancy, second trimester: Secondary | ICD-10-CM | POA: Diagnosis not present

## 2022-12-23 DIAGNOSIS — O09292 Supervision of pregnancy with other poor reproductive or obstetric history, second trimester: Secondary | ICD-10-CM | POA: Diagnosis not present

## 2022-12-23 DIAGNOSIS — Z3491 Encounter for supervision of normal pregnancy, unspecified, first trimester: Secondary | ICD-10-CM

## 2022-12-23 DIAGNOSIS — Z3A14 14 weeks gestation of pregnancy: Secondary | ICD-10-CM | POA: Diagnosis not present

## 2022-12-23 DIAGNOSIS — O34219 Maternal care for unspecified type scar from previous cesarean delivery: Secondary | ICD-10-CM

## 2022-12-23 DIAGNOSIS — O132 Gestational [pregnancy-induced] hypertension without significant proteinuria, second trimester: Secondary | ICD-10-CM

## 2022-12-23 DIAGNOSIS — Z86718 Personal history of other venous thrombosis and embolism: Secondary | ICD-10-CM

## 2022-12-23 DIAGNOSIS — Z3687 Encounter for antenatal screening for uncertain dates: Secondary | ICD-10-CM | POA: Insufficient documentation

## 2022-12-23 DIAGNOSIS — O09892 Supervision of other high risk pregnancies, second trimester: Secondary | ICD-10-CM | POA: Insufficient documentation

## 2022-12-23 DIAGNOSIS — D571 Sickle-cell disease without crisis: Secondary | ICD-10-CM | POA: Insufficient documentation

## 2022-12-23 DIAGNOSIS — O2232 Deep phlebothrombosis in pregnancy, second trimester: Secondary | ICD-10-CM | POA: Diagnosis not present

## 2022-12-23 DIAGNOSIS — Z363 Encounter for antenatal screening for malformations: Secondary | ICD-10-CM | POA: Insufficient documentation

## 2022-12-23 DIAGNOSIS — Z148 Genetic carrier of other disease: Secondary | ICD-10-CM | POA: Insufficient documentation

## 2022-12-23 DIAGNOSIS — Z3492 Encounter for supervision of normal pregnancy, unspecified, second trimester: Secondary | ICD-10-CM

## 2022-12-29 ENCOUNTER — Other Ambulatory Visit: Payer: Medicaid Other

## 2022-12-30 ENCOUNTER — Encounter: Payer: Self-pay | Admitting: Family Medicine

## 2023-01-02 ENCOUNTER — Other Ambulatory Visit (HOSPITAL_COMMUNITY)
Admission: RE | Admit: 2023-01-02 | Discharge: 2023-01-02 | Disposition: A | Discharge status: Home / Self Care | Payer: Medicaid Other | Source: Ambulatory Visit | Attending: Family Medicine | Admitting: Family Medicine

## 2023-01-02 ENCOUNTER — Encounter: Payer: Self-pay | Admitting: Family Medicine

## 2023-01-02 ENCOUNTER — Ambulatory Visit (INDEPENDENT_AMBULATORY_CARE_PROVIDER_SITE_OTHER): Payer: Medicaid Other | Admitting: Family Medicine

## 2023-01-02 ENCOUNTER — Other Ambulatory Visit: Payer: Self-pay

## 2023-01-02 VITALS — BP 116/79 | HR 88 | Wt 179.1 lb

## 2023-01-02 DIAGNOSIS — S3769XD Other injury of uterus, subsequent encounter: Secondary | ICD-10-CM

## 2023-01-02 DIAGNOSIS — O99019 Anemia complicating pregnancy, unspecified trimester: Secondary | ICD-10-CM

## 2023-01-02 DIAGNOSIS — Z98891 History of uterine scar from previous surgery: Secondary | ICD-10-CM | POA: Diagnosis not present

## 2023-01-02 DIAGNOSIS — Z8759 Personal history of other complications of pregnancy, childbirth and the puerperium: Secondary | ICD-10-CM

## 2023-01-02 DIAGNOSIS — O99012 Anemia complicating pregnancy, second trimester: Secondary | ICD-10-CM

## 2023-01-02 DIAGNOSIS — O0992 Supervision of high risk pregnancy, unspecified, second trimester: Secondary | ICD-10-CM

## 2023-01-02 DIAGNOSIS — O0990 Supervision of high risk pregnancy, unspecified, unspecified trimester: Secondary | ICD-10-CM | POA: Diagnosis present

## 2023-01-02 DIAGNOSIS — Z3A15 15 weeks gestation of pregnancy: Secondary | ICD-10-CM

## 2023-01-02 DIAGNOSIS — Z86718 Personal history of other venous thrombosis and embolism: Secondary | ICD-10-CM

## 2023-01-02 DIAGNOSIS — D571 Sickle-cell disease without crisis: Secondary | ICD-10-CM

## 2023-01-02 MED ORDER — ASPIRIN 81 MG PO CHEW: 81.0000 mg | CHEWABLE_TABLET | Freq: Every day | ORAL | 2 refills | Status: DC

## 2023-01-02 NOTE — Progress Notes (Signed)
Subjective:   Sonya Tapia is a 32 y.o. G3P2002 at 78w6dby midtrimester ultrasound being seen today for her first obstetrical visit.  Her obstetrical history is significant for pre-eclampsia and Sickle-C disease, previous C-section with h/o uterine rupture, h/o DVT .  Pregnancy history fully reviewed.  Patient reports no complaints.  HISTORY: OB History  Gravida Para Term Preterm AB Living  3 2 2 $ 0 0 2  SAB IAB Ectopic Multiple Live Births  0 0 0 0 2    # Outcome Date GA Lbr Len/2nd Weight Sex Delivery Anes PTL Lv  3 Current           2 Term 06/07/22 [id number] lb 9.3 oz (3.44 kg) M CS-LTranv EPI  LIV     Complications: Uterine rupture     Name: Machorro,BOY Larue     Apgar1: 1  Apgar5: 5  1 Term 03/26/20   8 lb (3.629 kg) M CS-Unspec  N LIV     Complications: Failure to Progress in First Stage   Last pap smear was  05/2022 and was normal Past Medical History:  Diagnosis Date   DVT (deep venous thrombosis) (HCC)    Pregnancy induced hypertension    S[place]le cell anemia ([place]   Past Surgical History:  Procedure Laterality Date   CESAREAN SECTION     CESAREAN SECTION N/A 06/07/2022   Procedure: CESAREAN SECTION;  Surgeon: AnOsborne OmanMD;  Location: [facility];  Service: Obstetrics;  Laterality: N/A;   Family History  Problem Relation Age of Onset   Hypertension Mother    Healthy Father    Social History   Tobacco Use   Smoking status: Never   Smokeless tobacco: Never  Vaping Use   Vaping Use: Never used  Substance Use Topics   Alcohol use: Not Currently    Comment: not while preg   Drug use: Never   No Known Allergies Current Outpatient Medications on File Prior to Visit  Medication Sig Dispense Refill   enoxaparin (LOVENOX) 40 MG/0.4ML injection Inject 0.4 mLs (40 mg total) into the skin daily for 360 doses. 12 mL 11   ergocalciferol (VITAMIN D2) 1.25 MG (50000 UT) capsule Take 50,000 Units by mouth once a week.     ferrous sulfate (FERROUSUL) 325  (65 FE) MG tablet Take 1 tablet (325 mg total) by mouth every other day. 60 tablet 1   FOLIC ACID PO Take 1 capsule by mouth daily.     Prenatal Vit-Fe Fumarate-FA (MULTIVITAMIN-PRENATAL) 27-0.8 MG TABS tablet Take 1 tablet by mouth daily at 12 noon.     No current facility-administered medications on file prior to visit.     Exam   Vitals:   01/02/23 0930  BP: (!) 143/84  Pulse: 85  Weight: 179 lb 1.6 oz (81.2 kg)   Fetal Heart Rate (bpm): 145  Uterus:   15 weeks size  System: General: well-developed, well-nourished female in no acute distress   Skin: normal coloration and turgor, no rashes   Neurologic: oriented, normal, negative, normal mood   Extremities: normal strength, tone, and muscle mass, ROM of all joints is normal   HEENT PERRLA, extraocular movement intact and sclera clear, anicteric   Mouth/Teeth mucous membranes moist, pharynx normal without lesions and dental hygiene good   Neck supple and no masses   Cardiovascular: regular rate and rhythm   Respiratory:  no respiratory distress, normal breath sounds   Abdomen: soft, non-tender; bowel sounds  normal; no masses,  no organomegaly     Assessment:   Pregnancy: [id number] Patient Active Problem List   Diagnosis Date Noted   Supervision of high risk pregnancy, antepartum 01/02/2023   Medication monitoring encounter 07/16/2022   Sepsis without acute organ dysfunction (HCC)    Hydronephrosis with ureteropelvic junction (UPJ) obstruction    Rectus sheath hematoma 06/12/2022   Postpartum endometritis 06/12/2022   Thrombocytopenia ([place]) 06/11/2022   History of gestational hypertension 06/07/2022   Hx Uterine rupture 06/07/2022   [place] disease ([place]) 03/20/2022   Alpha thalassemia silent carrier 03/20/2022   History of DVT (deep vein thrombosis) 03/20/2022   Previous cesarean section 02/06/2022   Sickle-HgbC anemia of mother during pregnancy ([place]) 01/23/2022   Vitamin D deficiency 07/16/2021     Plan:  1.  Supervision of high risk pregnancy, antepartum New OB labs today - CBC/D/Plt+RPR+Rh+ABO+RubIgG... - AFP, Serum, Open Spina Bifida - HgB A1c - Culture, OB Urine - GC/Chlamydia probe amp ([place])not at [facility] - Panorama Prenatal Test Full Panel  2. Rupture of uterus, subsequent encounter Will need RCS @ 37 weeks  3. Sickle-HgbC anemia of mother during pregnancy [facility]) No crises since age 21, on folic acid  4. Previous cesarean section X 2, will need repeat and likely @ 37 weeks due to hx  5. History of gestational hypertension Baseline labs Begin Baby ASA - Comprehensive metabolic panel - Protein / creatinine ratio, urine - TSH - aspirin 81 MG chewable tablet; Chew 1 tablet (81 mg total) by mouth daily.  Dispense: 90 tablet; Refill: 2  6. History of DVT (deep vein thrombosis) On Lovenox   Initial labs drawn. Continue prenatal vitamins. Genetic Screening discussed, NIPS: ordered. Ultrasound discussed; fetal anatomic survey: ordered. Problem list reviewed and updated. The nature of [place] with multiple MDs and other Advanced Practice Providers was explained to patient; also emphasized that residents, students are part of our team. Routine obstetric precautions reviewed. Return in 4 weeks (on 01/30/2023) for [facility].

## 2023-01-02 NOTE — Addendum Note (Signed)
Addended byMariane Baumgarten on: 01/02/2023 10:17 AM   Modules accepted: Orders

## 2023-01-04 LAB — URINE CULTURE, OB REFLEX

## 2023-01-04 LAB — PROTEIN / CREATININE RATIO, URINE
Creatinine, Urine: 115.5 mg/dL
Protein, Ur: 13.6 mg/dL
Protein/Creat Ratio: 118 mg/g creat (ref 0–200)

## 2023-01-04 LAB — CULTURE, OB URINE

## 2023-01-05 LAB — GC/CHLAMYDIA PROBE AMP (~~LOC~~) NOT AT ARMC
Chlamydia: NEGATIVE
Comment: NEGATIVE
Comment: NORMAL
Neisseria Gonorrhea: NEGATIVE

## 2023-01-06 LAB — AFP, SERUM, OPEN SPINA BIFIDA
AFP MoM: 0.66
AFP Value: 20.8 ng/mL
Gest. Age on Collection Date: 15.6 weeks
Maternal Age At EDD: 29.7 yr
OSBR Risk 1 IN: 10000
Test Results:: NEGATIVE
Weight: 179 [lb_av]

## 2023-01-06 LAB — COMPREHENSIVE METABOLIC PANEL
ALT: 22 IU/L (ref 0–32)
AST: 28 IU/L (ref 0–40)
Albumin/Globulin Ratio: 1.6 (ref 1.2–2.2)
Albumin: 4 g/dL (ref 4.0–5.0)
Alkaline Phosphatase: 41 IU/L — ABNORMAL LOW (ref 44–121)
BUN/Creatinine Ratio: 7 — ABNORMAL LOW (ref 9–23)
BUN: 5 mg/dL — ABNORMAL LOW (ref 6–20)
Bilirubin Total: 0.7 mg/dL (ref 0.0–1.2)
CO2: 18 mmol/L — ABNORMAL LOW (ref 20–29)
Calcium: 9.1 mg/dL (ref 8.7–10.2)
Chloride: 105 mmol/L (ref 96–106)
Creatinine, Ser: 0.75 mg/dL (ref 0.57–1.00)
Globulin, Total: 2.5 g/dL (ref 1.5–4.5)
Glucose: 97 mg/dL (ref 70–99)
Potassium: 4.2 mmol/L (ref 3.5–5.2)
Sodium: 140 mmol/L (ref 134–144)
Total Protein: 6.5 g/dL (ref 6.0–8.5)
eGFR: 110 mL/min/{1.73_m2} (ref >59)

## 2023-01-06 LAB — CBC/D/PLT+RPR+RH+ABO+RUBIGG...
Antibody Screen: NEGATIVE
Basophils Absolute: 0 10*3/uL (ref 0.0–0.2)
Basos: 0 %
EOS (ABSOLUTE): 0 10*3/uL (ref 0.0–0.4)
Eos: 1 %
HCV Ab: NONREACTIVE
HIV Screen 4th Generation wRfx: NONREACTIVE
Hematocrit: 28.9 % — ABNORMAL LOW (ref 34.0–46.6)
Hemoglobin: 9.6 g/dL — ABNORMAL LOW (ref 11.1–15.9)
Hepatitis B Surface Ag: NEGATIVE
Immature Grans (Abs): 0 10*3/uL (ref 0.0–0.1)
Immature Granulocytes: 0 %
Lymphocytes Absolute: 1.7 10*3/uL (ref 0.7–3.1)
Lymphs: 28 %
MCH: 25.7 pg — ABNORMAL LOW (ref 26.6–33.0)
MCHC: 33.2 g/dL (ref 31.5–35.7)
MCV: 78 fL — ABNORMAL LOW (ref 79–97)
Monocytes Absolute: 0.4 10*3/uL (ref 0.1–0.9)
Monocytes: 6 %
Neutrophils Absolute: 3.9 10*3/uL (ref 1.4–7.0)
Neutrophils: 65 %
Platelets: 126 10*3/uL — ABNORMAL LOW (ref 150–450)
RBC: 3.73 x10E6/uL — ABNORMAL LOW (ref 3.77–5.28)
RDW: 15.7 % — ABNORMAL HIGH (ref 11.7–15.4)
RPR Ser Ql: NONREACTIVE
Rh Factor: POSITIVE
Rubella Antibodies, IGG: 4.64 index (ref >0.99)
WBC: 6 10*3/uL (ref 3.4–10.8)

## 2023-01-06 LAB — HEMOGLOBIN A1C
Est. average glucose Bld gHb Est-mCnc: <74 mg/dL
Hgb A1c MFr Bld: <4.2 % — ABNORMAL LOW (ref 4.8–5.6)

## 2023-01-06 LAB — TSH: TSH: 0.946 u[IU]/mL (ref 0.450–4.500)

## 2023-01-06 LAB — HCV INTERPRETATION

## 2023-01-10 LAB — PANORAMA PRENATAL TEST FULL PANEL:PANORAMA TEST PLUS 5 ADDITIONAL MICRODELETIONS: FETAL FRACTION: 5.4

## 2023-01-15 ENCOUNTER — Telehealth: Payer: Self-pay | Admitting: Lactation Services

## 2023-01-15 NOTE — Telephone Encounter (Signed)
Received message that Horizon testing not performed. Spoke with [name] who reports patient was tested in 2023 and repeat not warranted.

## 2023-01-19 ENCOUNTER — Encounter: Payer: Self-pay | Admitting: Family Medicine

## 2023-01-27 ENCOUNTER — Ambulatory Visit (INDEPENDENT_AMBULATORY_CARE_PROVIDER_SITE_OTHER): Payer: Medicaid Other | Admitting: Family Medicine

## 2023-01-27 ENCOUNTER — Ambulatory Visit: Payer: Medicaid Other | Admitting: *Deleted

## 2023-01-27 ENCOUNTER — Ambulatory Visit: Payer: Medicaid Other

## 2023-01-27 ENCOUNTER — Other Ambulatory Visit: Payer: Self-pay | Admitting: *Deleted

## 2023-01-27 ENCOUNTER — Ambulatory Visit: Payer: Medicaid Other | Attending: Obstetrics

## 2023-01-27 ENCOUNTER — Encounter: Payer: Self-pay | Admitting: *Deleted

## 2023-01-27 ENCOUNTER — Other Ambulatory Visit: Payer: Self-pay

## 2023-01-27 VITALS — BP 127/84 | HR 87 | Wt 187.1 lb

## 2023-01-27 VITALS — BP 117/76 | HR 87

## 2023-01-27 DIAGNOSIS — O132 Gestational [pregnancy-induced] hypertension without significant proteinuria, second trimester: Secondary | ICD-10-CM | POA: Insufficient documentation

## 2023-01-27 DIAGNOSIS — O0990 Supervision of high risk pregnancy, unspecified, unspecified trimester: Secondary | ICD-10-CM

## 2023-01-27 DIAGNOSIS — Z86718 Personal history of other venous thrombosis and embolism: Secondary | ICD-10-CM | POA: Diagnosis not present

## 2023-01-27 DIAGNOSIS — O99112 Other diseases of the blood and blood-forming organs and certain disorders involving the immune mechanism complicating pregnancy, second trimester: Secondary | ICD-10-CM | POA: Insufficient documentation

## 2023-01-27 DIAGNOSIS — O34219 Maternal care for unspecified type scar from previous cesarean delivery: Secondary | ICD-10-CM | POA: Diagnosis present

## 2023-01-27 DIAGNOSIS — Z98891 History of uterine scar from previous surgery: Secondary | ICD-10-CM

## 2023-01-27 DIAGNOSIS — O09892 Supervision of other high risk pregnancies, second trimester: Secondary | ICD-10-CM | POA: Diagnosis present

## 2023-01-27 DIAGNOSIS — D696 Thrombocytopenia, unspecified: Secondary | ICD-10-CM | POA: Diagnosis not present

## 2023-01-27 DIAGNOSIS — O10912 Unspecified pre-existing hypertension complicating pregnancy, second trimester: Secondary | ICD-10-CM | POA: Diagnosis not present

## 2023-01-27 DIAGNOSIS — O99212 Obesity complicating pregnancy, second trimester: Secondary | ICD-10-CM | POA: Insufficient documentation

## 2023-01-27 DIAGNOSIS — Z3A19 19 weeks gestation of pregnancy: Secondary | ICD-10-CM | POA: Diagnosis not present

## 2023-01-27 DIAGNOSIS — O0992 Supervision of high risk pregnancy, unspecified, second trimester: Secondary | ICD-10-CM

## 2023-01-27 DIAGNOSIS — Z7901 Long term (current) use of anticoagulants: Secondary | ICD-10-CM

## 2023-01-27 DIAGNOSIS — O99012 Anemia complicating pregnancy, second trimester: Secondary | ICD-10-CM | POA: Diagnosis not present

## 2023-01-27 DIAGNOSIS — D571 Sickle-cell disease without crisis: Secondary | ICD-10-CM | POA: Diagnosis not present

## 2023-01-27 DIAGNOSIS — E669 Obesity, unspecified: Secondary | ICD-10-CM

## 2023-01-27 DIAGNOSIS — O99210 Obesity complicating pregnancy, unspecified trimester: Secondary | ICD-10-CM

## 2023-01-27 DIAGNOSIS — O09292 Supervision of pregnancy with other poor reproductive or obstetric history, second trimester: Secondary | ICD-10-CM | POA: Diagnosis not present

## 2023-01-27 DIAGNOSIS — Z363 Encounter for antenatal screening for malformations: Secondary | ICD-10-CM | POA: Insufficient documentation

## 2023-01-27 DIAGNOSIS — D563 Thalassemia minor: Secondary | ICD-10-CM

## 2023-01-27 DIAGNOSIS — O3429 Maternal care due to uterine scar from other previous surgery: Secondary | ICD-10-CM | POA: Diagnosis not present

## 2023-01-27 DIAGNOSIS — S3769XD Other injury of uterus, subsequent encounter: Secondary | ICD-10-CM

## 2023-01-27 DIAGNOSIS — Z8759 Personal history of other complications of pregnancy, childbirth and the puerperium: Secondary | ICD-10-CM

## 2023-01-27 DIAGNOSIS — Z362 Encounter for other antenatal screening follow-up: Secondary | ICD-10-CM

## 2023-01-27 DIAGNOSIS — Z3492 Encounter for supervision of normal pregnancy, unspecified, second trimester: Secondary | ICD-10-CM

## 2023-01-27 NOTE — Progress Notes (Signed)
   PRENATAL VISIT NOTE  Subjective:  Sonya Tapia is a 32 y.o. G3P2002 at [redacted]w[redacted]d being seen today for ongoing prenatal care.  She is currently monitored for the following issues for this high-risk pregnancy and has Sickle-HgbC anemia of mother during pregnancy [facility]); Previous cesarean section; Vitamin D deficiency; [place] disease ([place]); Alpha thalassemia silent carrier; History of DVT (deep vein thrombosis); History of gestational hypertension; Hx Uterine rupture; Thrombocytopenia ([place]); Rectus sheath hematoma; Sepsis without acute organ dysfunction ([place]); Hydronephrosis with ureteropelvic junction (UPJ) obstruction; Medication monitoring encounter; and Supervision of high risk pregnancy, antepartum on their problem list.  Patient reports no bleeding, no contractions, no cramping, and no leaking.  Contractions: Not present. Vag. Bleeding: None.  Movement: Absent. Denies leaking of fluid.   The following portions of the patient's history were reviewed and updated as appropriate: allergies, current medications, past family history, past medical history, past social history, past surgical history and problem list.   Objective:   Vitals:   01/27/23 0914  BP: 127/84  Pulse: 87  Weight: 187 lb 1.6 oz (84.9 kg)    Fetal Status: Fetal Heart Rate (bpm): 147   Movement: Absent     General:  Alert, oriented and cooperative. Patient is in no acute distress.  Skin: Skin is warm and dry. No rash noted.   Cardiovascular: Normal heart rate noted  Respiratory: Normal respiratory effort, no problems with respiration noted  Abdomen: Soft, gravid, appropriate for gestational age.  Pain/Pressure: Absent     Pelvic: Cervical exam deferred        Extremities: Normal range of motion.  Edema: None  Mental Status: Normal mood and affect. Normal behavior. Normal judgment and thought content.   Assessment and Plan:  Pregnancy: G3P2002 at [redacted]w[redacted]d 1. Supervision of high risk pregnancy, antepartum Continue routine  prenatal care Next visit in 4 weeks  2. Rupture of uterus, subsequent encounter Planning on repeat cesarean delivery, likely at 37 weeks  3. Sickle-HgbC anemia of mother during pregnancy [facility]) No issues since she was 32 years old  47. Previous cesarean section CS x 2 with uterine rupture  5. History of gestational hypertension Continuing 81 mg ASA  6. History of DVT (deep vein thrombosis) Currently on Lovenox  7. [redacted] weeks gestation of pregnancy   Preterm labor symptoms and general obstetric precautions including but not limited to vaginal bleeding, contractions, leaking of fluid and fetal movement were reviewed in detail with the patient. Please refer to After Visit Summary for other counseling recommendations.   No follow-ups on file.  Future Appointments  Date Time Provider [place]  01/27/2023  1:30 PM [facility] NURSE [facility]  01/27/2023  1:45 PM [facility] US4 [facility]  02/25/2023 10:55 AM Aletha Halim, MD [facility]    Concepcion Living, MD

## 2023-02-16 ENCOUNTER — Other Ambulatory Visit: Payer: Self-pay | Admitting: Obstetrics and Gynecology

## 2023-02-25 ENCOUNTER — Other Ambulatory Visit: Payer: Self-pay

## 2023-02-25 ENCOUNTER — Encounter: Payer: Self-pay | Admitting: Obstetrics and Gynecology

## 2023-02-25 ENCOUNTER — Ambulatory Visit (INDEPENDENT_AMBULATORY_CARE_PROVIDER_SITE_OTHER): Payer: Medicaid Other | Admitting: Obstetrics and Gynecology

## 2023-02-25 VITALS — BP 115/76 | HR 102 | Wt 190.0 lb

## 2023-02-25 DIAGNOSIS — O09899 Supervision of other high risk pregnancies, unspecified trimester: Secondary | ICD-10-CM

## 2023-02-25 DIAGNOSIS — Z86718 Personal history of other venous thrombosis and embolism: Secondary | ICD-10-CM

## 2023-02-25 DIAGNOSIS — O09892 Supervision of other high risk pregnancies, second trimester: Secondary | ICD-10-CM

## 2023-02-25 DIAGNOSIS — Z8759 Personal history of other complications of pregnancy, childbirth and the puerperium: Secondary | ICD-10-CM

## 2023-02-25 DIAGNOSIS — D571 Sickle-cell disease without crisis: Secondary | ICD-10-CM

## 2023-02-25 DIAGNOSIS — S3769XD Other injury of uterus, subsequent encounter: Secondary | ICD-10-CM

## 2023-02-25 DIAGNOSIS — D563 Thalassemia minor: Secondary | ICD-10-CM

## 2023-02-25 DIAGNOSIS — Z3A23 23 weeks gestation of pregnancy: Secondary | ICD-10-CM

## 2023-02-25 DIAGNOSIS — O99012 Anemia complicating pregnancy, second trimester: Secondary | ICD-10-CM

## 2023-02-25 DIAGNOSIS — Z98891 History of uterine scar from previous surgery: Secondary | ICD-10-CM

## 2023-02-25 DIAGNOSIS — D696 Thrombocytopenia, unspecified: Secondary | ICD-10-CM

## 2023-02-25 NOTE — Progress Notes (Signed)
   PRENATAL VISIT NOTE  Subjective:  Sonya Tapia is a 32 y.o. G3P2002 at [redacted]w[redacted]d being seen today for ongoing prenatal care.  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  Patient reports no complaints.  Contractions: Not present. Vag. Bleeding: None.  Movement: Present. Denies leaking of fluid.   The following portions of the patient's history were reviewed and updated as appropriate: allergies, current medications, past family history, past medical history, past social history, past surgical history and problem list.   Objective:   Vitals:   02/25/23 1035  BP: 115/76  Pulse: (!) 102  Weight: 190 lb (86.2 kg)    Fetal Status: Fetal Heart Rate (bpm): 158   Movement: Present     General:  Alert, oriented and cooperative. Patient is in no acute distress.  Skin: Skin is warm and dry. No rash noted.   Cardiovascular: Normal heart rate noted  Respiratory: Normal respiratory effort, no problems with respiration noted  Abdomen: Soft, gravid, appropriate for gestational age.  Pain/Pressure: Absent     Pelvic: Cervical exam deferred        Extremities: Normal range of motion.  Edema: None  Mental Status: Normal mood and affect. Normal behavior. Normal judgment and thought content.   Assessment and Plan:  Pregnancy: G3P2002 at [redacted]w[redacted]d 1. Alpha thalassemia silent carrier She has a hematologist, but I asked her if she's ever talked to genetics about her alpha thal [place], hgb [place] disease and beta  hemoglobinopathy variants and she doesn't believe she has. I offered her a consult and pt to consider. Her children have some sort of trait (pt unsure), and I also d/w her re: FOB saliva testing with [place] if she desires.  - Anemia Profile B  2. Anemia during pregnancy in second trimester - Anemia Profile B  3. Thrombocytopenia - Anemia Profile B  4. [redacted] weeks gestation of pregnancy GTT next visit. I d/w her re: BTL at time of c/s. Follow up at subsequent visits.  - Anemia Profile B  5. Rupture of uterus, subsequent encounter Rpt c/s at 37wks; 36wks if has UCs  6. 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 Short interval between pregnancies affecting pregnancy, antepartum  8. Previous cesarean section  9. History of gestational hypertension Continue low dose asa  10. History of DVT (deep vein thrombosis) Pt confirms on qday ppx lovenox  Preterm labor symptoms and general obstetric precautions including but not limited to vaginal bleeding, contractions, leaking of fluid and fetal movement were reviewed in detail with the patient. Please refer to After Visit Summary for other counseling recommendations.   No follow-ups on file.  Future Appointments  Date Time Provider Department Center  03/03/2023  8:45 AM [facility] NURSE [facility]  03/03/2023  9:00 AM [facility] US1 [facility]     Bing, MD

## 2023-02-26 LAB — ANEMIA PROFILE B
Basophils Absolute: 0.1 10*3/uL (ref 0.0–0.2)
Basos: 1 %
EOS (ABSOLUTE): 0.1 10*3/uL (ref 0.0–0.4)
Eos: 2 %
Ferritin: 88 ng/mL (ref 15–150)
Folate: >20 ng/mL (ref >3.0)
Hematocrit: 32.1 % — ABNORMAL LOW (ref 34.0–46.6)
Hemoglobin: 10.4 g/dL — ABNORMAL LOW (ref 11.1–15.9)
Immature Grans (Abs): 0 10*3/uL (ref 0.0–0.1)
Immature Granulocytes: 0 %
Iron Saturation: 53 % (ref 15–55)
Iron: 146 ug/dL (ref 27–159)
Lymphocytes Absolute: 1.9 10*3/uL (ref 0.7–3.1)
Lymphs: 23 %
MCH: 27.3 pg (ref 26.6–33.0)
MCHC: 32.4 g/dL (ref 31.5–35.7)
MCV: 84 fL (ref 79–97)
Monocytes Absolute: 0.5 10*3/uL (ref 0.1–0.9)
Monocytes: 7 %
Neutrophils Absolute: 5.6 10*3/uL (ref 1.4–7.0)
Neutrophils: 67 %
Platelets: 112 10*3/uL — ABNORMAL LOW (ref 150–450)
RBC: 3.81 x10E6/uL (ref 3.77–5.28)
RDW: 16 % — ABNORMAL HIGH (ref 11.7–15.4)
Retic Ct Pct: 3.9 % — ABNORMAL HIGH (ref 0.6–2.6)
Total Iron Binding Capacity: 275 ug/dL (ref 250–450)
UIBC: 129 ug/dL — ABNORMAL LOW (ref 131–425)
Vitamin B-12: 371 pg/mL (ref 232–1245)
WBC: 8.2 10*3/uL (ref 3.4–10.8)

## 2023-03-02 ENCOUNTER — Telehealth: Payer: Self-pay | Admitting: Lactation Services

## 2023-03-02 ENCOUNTER — Other Ambulatory Visit: Payer: Medicaid Other

## 2023-03-02 DIAGNOSIS — D696 Thrombocytopenia, unspecified: Secondary | ICD-10-CM

## 2023-03-02 NOTE — Telephone Encounter (Signed)
Called patient about coming in tomorrow for lab for platelet count. Patient will be in the office tomorrow for MFM appointment and will stop by for lab before or after appointment.

## 2023-03-02 NOTE — Telephone Encounter (Signed)
-----   Message from Kenly Bing, MD sent at 03/02/2023  1:30 PM EDT ----- Please add her on to the lab schedule tomorrow for a platelet count. It looks like she has an 845 mfm u/s appointment for tomorrow. Thanks!

## 2023-03-03 ENCOUNTER — Other Ambulatory Visit: Payer: Medicaid Other

## 2023-03-03 ENCOUNTER — Ambulatory Visit: Payer: Medicaid Other | Admitting: *Deleted

## 2023-03-03 ENCOUNTER — Ambulatory Visit: Payer: Medicaid Other | Attending: Obstetrics and Gynecology

## 2023-03-03 ENCOUNTER — Other Ambulatory Visit: Payer: Self-pay | Admitting: *Deleted

## 2023-03-03 ENCOUNTER — Other Ambulatory Visit: Payer: Self-pay

## 2023-03-03 ENCOUNTER — Ambulatory Visit: Payer: Medicaid Other

## 2023-03-03 VITALS — BP 115/75 | HR 91

## 2023-03-03 DIAGNOSIS — O09292 Supervision of pregnancy with other poor reproductive or obstetric history, second trimester: Secondary | ICD-10-CM

## 2023-03-03 DIAGNOSIS — Z86718 Personal history of other venous thrombosis and embolism: Secondary | ICD-10-CM | POA: Diagnosis present

## 2023-03-03 DIAGNOSIS — O34219 Maternal care for unspecified type scar from previous cesarean delivery: Secondary | ICD-10-CM

## 2023-03-03 DIAGNOSIS — O99012 Anemia complicating pregnancy, second trimester: Secondary | ICD-10-CM | POA: Diagnosis not present

## 2023-03-03 DIAGNOSIS — Z7901 Long term (current) use of anticoagulants: Secondary | ICD-10-CM

## 2023-03-03 DIAGNOSIS — O0990 Supervision of high risk pregnancy, unspecified, unspecified trimester: Secondary | ICD-10-CM | POA: Insufficient documentation

## 2023-03-03 DIAGNOSIS — Z87828 Personal history of other (healed) physical injury and trauma: Secondary | ICD-10-CM

## 2023-03-03 DIAGNOSIS — D571 Sickle-cell disease without crisis: Secondary | ICD-10-CM | POA: Diagnosis not present

## 2023-03-03 DIAGNOSIS — Z3A24 24 weeks gestation of pregnancy: Secondary | ICD-10-CM

## 2023-03-03 DIAGNOSIS — D696 Thrombocytopenia, unspecified: Secondary | ICD-10-CM

## 2023-03-03 DIAGNOSIS — O99212 Obesity complicating pregnancy, second trimester: Secondary | ICD-10-CM

## 2023-03-03 DIAGNOSIS — O3429 Maternal care due to uterine scar from other previous surgery: Secondary | ICD-10-CM | POA: Diagnosis not present

## 2023-03-03 DIAGNOSIS — O285 Abnormal chromosomal and genetic finding on antenatal screening of mother: Secondary | ICD-10-CM

## 2023-03-03 DIAGNOSIS — D563 Thalassemia minor: Secondary | ICD-10-CM

## 2023-03-03 DIAGNOSIS — O99210 Obesity complicating pregnancy, unspecified trimester: Secondary | ICD-10-CM | POA: Insufficient documentation

## 2023-03-03 DIAGNOSIS — Z362 Encounter for other antenatal screening follow-up: Secondary | ICD-10-CM | POA: Insufficient documentation

## 2023-03-03 DIAGNOSIS — O09892 Supervision of other high risk pregnancies, second trimester: Secondary | ICD-10-CM

## 2023-03-03 DIAGNOSIS — E669 Obesity, unspecified: Secondary | ICD-10-CM

## 2023-03-03 DIAGNOSIS — O99112 Other diseases of the blood and blood-forming organs and certain disorders involving the immune mechanism complicating pregnancy, second trimester: Secondary | ICD-10-CM

## 2023-03-04 LAB — PLATELET COUNT: Platelets: 106 10*3/uL — ABNORMAL LOW (ref 150–450)

## 2023-03-18 ENCOUNTER — Other Ambulatory Visit: Payer: Self-pay

## 2023-03-18 ENCOUNTER — Other Ambulatory Visit: Payer: Medicaid Other

## 2023-03-18 ENCOUNTER — Ambulatory Visit (INDEPENDENT_AMBULATORY_CARE_PROVIDER_SITE_OTHER): Payer: Medicaid Other | Admitting: Obstetrics & Gynecology

## 2023-03-18 VITALS — BP 103/71 | HR 83 | Wt 196.0 lb

## 2023-03-18 DIAGNOSIS — Z8759 Personal history of other complications of pregnancy, childbirth and the puerperium: Secondary | ICD-10-CM

## 2023-03-18 DIAGNOSIS — Z23 Encounter for immunization: Secondary | ICD-10-CM

## 2023-03-18 DIAGNOSIS — O0992 Supervision of high risk pregnancy, unspecified, second trimester: Secondary | ICD-10-CM

## 2023-03-18 DIAGNOSIS — O0990 Supervision of high risk pregnancy, unspecified, unspecified trimester: Secondary | ICD-10-CM

## 2023-03-18 DIAGNOSIS — Z98891 History of uterine scar from previous surgery: Secondary | ICD-10-CM

## 2023-03-18 DIAGNOSIS — Z3A26 26 weeks gestation of pregnancy: Secondary | ICD-10-CM

## 2023-03-18 DIAGNOSIS — D5720 Sickle-cell/Hb-C disease without crisis: Secondary | ICD-10-CM

## 2023-03-18 NOTE — Progress Notes (Signed)
   PRENATAL VISIT NOTE  Subjective:  Sonya Tapia is a 32 y.o. G3P2002 at [redacted]w[redacted]d being seen today for ongoing prenatal care.  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    Contractions: Not present. Vag. Bleeding: None.  Movement: Present. Denies leaking of fluid.   The following portions of the patient's history were reviewed and updated as appropriate: allergies, current medications, past family history, past medical history, past social history, past surgical history and problem list.   Objective:   Vitals:   03/18/23 0842  BP: 103/71  Pulse: 83  Weight: 196 lb (88.9 kg)    Fetal Status: Fetal Heart Rate (bpm): 146   Movement: Present     General:  Alert, oriented and cooperative. Patient is in no acute distress.  Skin: Skin is warm and dry. No rash noted.   Cardiovascular: Normal heart rate noted  Respiratory: Normal respiratory effort, no problems with respiration noted  Abdomen: Soft, gravid, appropriate for gestational age.  Pain/Pressure: Absent     Pelvic: Cervical exam deferred        Extremities: Normal range of motion.  Edema: None  Mental Status: Normal mood and affect. Normal behavior. Normal judgment and thought content.   Assessment and Plan:  Pregnancy: G3P2002 at [redacted]w[redacted]d 1. Supervision of high risk pregnancy, antepartum   2. History of gestational hypertension ASA daily   3. Previous cesarean section Repeat   4. Sickle cell-hemoglobin C disease  without crisis (HCC)   Preterm labor symptoms and general obstetric precautions including but not limited to vaginal bleeding, contractions, leaking of fluid and fetal movement were reviewed in detail with the patient. Please refer to After Visit Summary for other counseling recommendations.   Return in about 4 weeks (around 04/15/2023).  Future Appointments  Date Time Provider Department Center  03/31/2023 12:30 PM [facility] NURSE [facility]  03/31/2023 12:45 PM [facility] US4 [facility]  04/16/2023  8:55 AM [place] Bing, MD [facility]  04/28/2023  9:15 AM [facility] NURSE [facility]  04/28/2023  9:30 AM [facility] US3 [facility]  05/05/2023 10:15 AM [facility] NURSE [facility]  05/05/2023 10:30 AM [facility] US2 [facility]    Scheryl Darter, MD

## 2023-03-19 LAB — GLUCOSE TOLERANCE, 2 HOURS W/ 1HR
Glucose, 1 hour: 113 mg/dL (ref 70–179)
Glucose, 2 hour: 85 mg/dL (ref 70–152)
Glucose, Fasting: 76 mg/dL (ref 70–91)

## 2023-03-19 LAB — CBC
Hematocrit: 29.4 % — ABNORMAL LOW (ref 34.0–46.6)
Hemoglobin: 9.7 g/dL — ABNORMAL LOW (ref 11.1–15.9)
MCH: 27 pg (ref 26.6–33.0)
MCHC: 33 g/dL (ref 31.5–35.7)
MCV: 82 fL (ref 79–97)
Platelets: 100 10*3/uL — CL (ref 150–450)
RBC: 3.59 x10E6/uL — ABNORMAL LOW (ref 3.77–5.28)
RDW: 15.4 % (ref 11.7–15.4)
WBC: 6.7 10*3/uL (ref 3.4–10.8)

## 2023-03-19 LAB — RPR: RPR Ser Ql: NONREACTIVE

## 2023-03-19 LAB — HIV ANTIBODY (ROUTINE TESTING W REFLEX): HIV Screen 4th Generation wRfx: NONREACTIVE

## 2023-03-30 DIAGNOSIS — O99210 Obesity complicating pregnancy, unspecified trimester: Secondary | ICD-10-CM | POA: Insufficient documentation

## 2023-03-31 ENCOUNTER — Ambulatory Visit: Payer: Medicaid Other | Admitting: *Deleted

## 2023-03-31 ENCOUNTER — Encounter: Payer: Self-pay | Admitting: *Deleted

## 2023-03-31 ENCOUNTER — Ambulatory Visit: Payer: Medicaid Other | Attending: Obstetrics and Gynecology

## 2023-03-31 VITALS — BP 121/74 | HR 79

## 2023-03-31 DIAGNOSIS — O0990 Supervision of high risk pregnancy, unspecified, unspecified trimester: Secondary | ICD-10-CM

## 2023-03-31 DIAGNOSIS — O99213 Obesity complicating pregnancy, third trimester: Secondary | ICD-10-CM

## 2023-03-31 DIAGNOSIS — O3429 Maternal care due to uterine scar from other previous surgery: Secondary | ICD-10-CM

## 2023-03-31 DIAGNOSIS — Z87828 Personal history of other (healed) physical injury and trauma: Secondary | ICD-10-CM | POA: Diagnosis present

## 2023-03-31 DIAGNOSIS — O99212 Obesity complicating pregnancy, second trimester: Secondary | ICD-10-CM | POA: Diagnosis present

## 2023-03-31 DIAGNOSIS — O9902 Anemia complicating childbirth: Secondary | ICD-10-CM | POA: Insufficient documentation

## 2023-03-31 DIAGNOSIS — Z86718 Personal history of other venous thrombosis and embolism: Secondary | ICD-10-CM | POA: Diagnosis present

## 2023-03-31 DIAGNOSIS — D571 Sickle-cell disease without crisis: Secondary | ICD-10-CM | POA: Diagnosis present

## 2023-03-31 DIAGNOSIS — O99013 Anemia complicating pregnancy, third trimester: Secondary | ICD-10-CM

## 2023-03-31 DIAGNOSIS — E669 Obesity, unspecified: Secondary | ICD-10-CM

## 2023-03-31 DIAGNOSIS — Z8759 Personal history of other complications of pregnancy, childbirth and the puerperium: Secondary | ICD-10-CM | POA: Diagnosis present

## 2023-03-31 DIAGNOSIS — D563 Thalassemia minor: Secondary | ICD-10-CM

## 2023-03-31 DIAGNOSIS — O34219 Maternal care for unspecified type scar from previous cesarean delivery: Secondary | ICD-10-CM | POA: Insufficient documentation

## 2023-03-31 DIAGNOSIS — O285 Abnormal chromosomal and genetic finding on antenatal screening of mother: Secondary | ICD-10-CM

## 2023-03-31 DIAGNOSIS — O09293 Supervision of pregnancy with other poor reproductive or obstetric history, third trimester: Secondary | ICD-10-CM

## 2023-03-31 DIAGNOSIS — O09892 Supervision of other high risk pregnancies, second trimester: Secondary | ICD-10-CM

## 2023-03-31 DIAGNOSIS — Z3A28 28 weeks gestation of pregnancy: Secondary | ICD-10-CM

## 2023-03-31 DIAGNOSIS — O99113 Other diseases of the blood and blood-forming organs and certain disorders involving the immune mechanism complicating pregnancy, third trimester: Secondary | ICD-10-CM

## 2023-03-31 DIAGNOSIS — D696 Thrombocytopenia, unspecified: Secondary | ICD-10-CM | POA: Diagnosis present

## 2023-04-16 ENCOUNTER — Ambulatory Visit (INDEPENDENT_AMBULATORY_CARE_PROVIDER_SITE_OTHER): Payer: Medicaid Other | Admitting: Obstetrics and Gynecology

## 2023-04-16 ENCOUNTER — Other Ambulatory Visit: Payer: Self-pay

## 2023-04-16 VITALS — BP 114/75 | HR 97 | Wt 197.0 lb

## 2023-04-16 DIAGNOSIS — O0993 Supervision of high risk pregnancy, unspecified, third trimester: Secondary | ICD-10-CM

## 2023-04-16 DIAGNOSIS — Z8759 Personal history of other complications of pregnancy, childbirth and the puerperium: Secondary | ICD-10-CM

## 2023-04-16 DIAGNOSIS — O09899 Supervision of other high risk pregnancies, unspecified trimester: Secondary | ICD-10-CM

## 2023-04-16 DIAGNOSIS — S3769XD Other injury of uterus, subsequent encounter: Secondary | ICD-10-CM

## 2023-04-16 DIAGNOSIS — D696 Thrombocytopenia, unspecified: Secondary | ICD-10-CM

## 2023-04-16 DIAGNOSIS — O0990 Supervision of high risk pregnancy, unspecified, unspecified trimester: Secondary | ICD-10-CM

## 2023-04-16 DIAGNOSIS — Z3A30 30 weeks gestation of pregnancy: Secondary | ICD-10-CM

## 2023-04-16 DIAGNOSIS — O09893 Supervision of other high risk pregnancies, third trimester: Secondary | ICD-10-CM

## 2023-04-16 DIAGNOSIS — D5720 Sickle-cell/Hb-C disease without crisis: Secondary | ICD-10-CM

## 2023-04-16 DIAGNOSIS — O99013 Anemia complicating pregnancy, third trimester: Secondary | ICD-10-CM

## 2023-04-16 DIAGNOSIS — Z98891 History of uterine scar from previous surgery: Secondary | ICD-10-CM

## 2023-04-16 DIAGNOSIS — D571 Sickle-cell disease without crisis: Secondary | ICD-10-CM

## 2023-04-16 DIAGNOSIS — O99213 Obesity complicating pregnancy, third trimester: Secondary | ICD-10-CM

## 2023-04-16 DIAGNOSIS — D563 Thalassemia minor: Secondary | ICD-10-CM

## 2023-04-16 DIAGNOSIS — Z86718 Personal history of other venous thrombosis and embolism: Secondary | ICD-10-CM

## 2023-04-16 NOTE — Progress Notes (Signed)
PRENATAL VISIT NOTE  Subjective:  Sonya Tapia is a 32 y.o. G3P2002 at [redacted]w[redacted]d being seen today for ongoing prenatal care.  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  Patient reports no complaints.  Contractions: Not present. Vag. Bleeding: None.  Movement: Present. Denies leaking of fluid.   The following portions of the patient's history were reviewed and updated as appropriate: allergies, current medications, past family history, past medical history, past social history, past surgical history and problem list.   Objective:   Vitals:   04/16/23 0903  BP: 114/75  Pulse: 97  Weight: 197 lb (89.4 kg)    Fetal Status: Fetal Heart Rate (bpm): 138   Movement: Present     General:  Alert, oriented and cooperative. Patient is in no acute distress.  Skin: Skin is warm and dry. No rash noted.   Cardiovascular: Normal heart rate noted  Respiratory: Normal respiratory effort, no problems with respiration noted  Abdomen: Soft, gravid, appropriate for gestational age.  Pain/Pressure: Absent     Pelvic: Cervical exam deferred        Extremities: Normal range of motion.  Edema: None  Mental Status: Normal mood and affect. Normal behavior. Normal judgment and thought content.   Assessment and Plan:  Pregnancy: G3P2002 at [redacted]w[redacted]d 1. Supervision of high risk pregnancy, antepartum Doesn't desire BTL. F/u re: birth control next visit GTT wnl - Anemia Profile B  2.  Thrombocytopenia (HCC) Stable. F/u today. Repeat next visit since on lovenox and low dose asa.     Latest Ref Rng & Units 03/18/2023    8:54 AM 03/03/2023   10:28 AM 02/25/2023   11:09 AM  CBC  WBC 3.4 - 10.8 x10E3/uL 6.7   8.2   Hemoglobin 11.1 - 15.9 g/dL 9.7   16.1   Hematocrit 34.0 - 46.6 % 29.4   32.1   Platelets 150 - 450 x10E3/uL 100  106  112    - Anemia Profile B  3. Alpha thalassemia silent carrier See above - Anemia Profile B  4. [redacted] weeks gestation of pregnancy - Anemia Profile B  5. Rupture of uterus, subsequent encounter 37wk rpt c/s recommended. Pt requests 7/19 which was requested today  6. Short interval between pregnancies affecting pregnancy, antepartum  7. Previous cesarean section  8. 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 Sickle cell-hemoglobin C disease without crisis (HCC)  10. History of DVT (deep vein thrombosis) On ppx lovenox. Continue this for 6wks pp. Start ap texting next visit 5/14: 55%, 1302gm, afi wnl  11. History of gestational hypertension Continue low dose asa  12. Obesity affecting pregnancy in third trimester, unspecified obesity type  Preterm labor symptoms and general obstetric precautions including but not limited to vaginal bleeding, contractions, leaking of fluid and fetal movement were reviewed in detail with the patient. Please refer to After Visit Summary for other counseling recommendations.   Return in 12 days (on 04/28/2023) for in person, md visit, high risk ob.  Future Appointments  Date Time Provider Department Center  04/28/2023  9:15 AM [facility] NURSE [facility]  04/28/2023  9:30 AM [facility] US3 [facility]  05/05/2023 10:15 AM [facility] NURSE [facility]  05/05/2023 10:30 AM [facility] US2 [facility]  05/06/2023  3:55 PM [place] Fillers, MD [facility]    [place] Bing, MD

## 2023-04-18 LAB — ANEMIA PROFILE B
Basophils Absolute: 0 10*3/uL (ref 0.0–0.2)
Basos: 0 %
EOS (ABSOLUTE): 0.1 10*3/uL (ref 0.0–0.4)
Eos: 2 %
Ferritin: 53 ng/mL (ref 15–150)
Folate: >20 ng/mL (ref >3.0)
Hematocrit: 30.3 % — ABNORMAL LOW (ref 34.0–46.6)
Hemoglobin: 9.9 g/dL — ABNORMAL LOW (ref 11.1–15.9)
Iron Saturation: 41 % (ref 15–55)
Iron: 124 ug/dL (ref 27–159)
Lymphocytes Absolute: 2 10*3/uL (ref 0.7–3.1)
Lymphs: 32 %
MCH: 27.3 pg (ref 26.6–33.0)
MCHC: 32.7 g/dL (ref 31.5–35.7)
MCV: 84 fL (ref 79–97)
Monocytes Absolute: 0.3 10*3/uL (ref 0.1–0.9)
Monocytes: 5 %
Neutrophils Absolute: 3.9 10*3/uL (ref 1.4–7.0)
Neutrophils: 61 %
Platelets: 87 10*3/uL — CL (ref 150–450)
RBC: 3.63 x10E6/uL — ABNORMAL LOW (ref 3.77–5.28)
RDW: 15.8 % — ABNORMAL HIGH (ref 11.7–15.4)
Retic Ct Pct: 4.5 % — ABNORMAL HIGH (ref 0.6–2.6)
Total Iron Binding Capacity: 302 ug/dL (ref 250–450)
UIBC: 178 ug/dL (ref 131–425)
Vitamin B-12: 270 pg/mL (ref 232–1245)
WBC: 6.4 10*3/uL (ref 3.4–10.8)

## 2023-04-28 ENCOUNTER — Other Ambulatory Visit: Payer: Self-pay | Admitting: *Deleted

## 2023-04-28 ENCOUNTER — Ambulatory Visit: Payer: Medicaid Other | Attending: Obstetrics and Gynecology

## 2023-04-28 ENCOUNTER — Encounter: Payer: Self-pay | Admitting: *Deleted

## 2023-04-28 ENCOUNTER — Ambulatory Visit: Payer: Medicaid Other | Admitting: *Deleted

## 2023-04-28 VITALS — BP 116/71 | HR 88

## 2023-04-28 DIAGNOSIS — O0990 Supervision of high risk pregnancy, unspecified, unspecified trimester: Secondary | ICD-10-CM | POA: Diagnosis present

## 2023-04-28 DIAGNOSIS — O34219 Maternal care for unspecified type scar from previous cesarean delivery: Secondary | ICD-10-CM | POA: Diagnosis present

## 2023-04-28 DIAGNOSIS — D696 Thrombocytopenia, unspecified: Secondary | ICD-10-CM | POA: Diagnosis present

## 2023-04-28 DIAGNOSIS — Z87828 Personal history of other (healed) physical injury and trauma: Secondary | ICD-10-CM | POA: Diagnosis present

## 2023-04-28 DIAGNOSIS — D563 Thalassemia minor: Secondary | ICD-10-CM | POA: Diagnosis not present

## 2023-04-28 DIAGNOSIS — O3429 Maternal care due to uterine scar from other previous surgery: Secondary | ICD-10-CM

## 2023-04-28 DIAGNOSIS — Z86718 Personal history of other venous thrombosis and embolism: Secondary | ICD-10-CM | POA: Diagnosis present

## 2023-04-28 DIAGNOSIS — O99212 Obesity complicating pregnancy, second trimester: Secondary | ICD-10-CM | POA: Insufficient documentation

## 2023-04-28 DIAGNOSIS — O9902 Anemia complicating childbirth: Secondary | ICD-10-CM | POA: Insufficient documentation

## 2023-04-28 DIAGNOSIS — D571 Sickle-cell disease without crisis: Secondary | ICD-10-CM | POA: Insufficient documentation

## 2023-04-28 DIAGNOSIS — E669 Obesity, unspecified: Secondary | ICD-10-CM

## 2023-04-28 DIAGNOSIS — Z3A32 32 weeks gestation of pregnancy: Secondary | ICD-10-CM

## 2023-04-28 DIAGNOSIS — Z8759 Personal history of other complications of pregnancy, childbirth and the puerperium: Secondary | ICD-10-CM | POA: Diagnosis present

## 2023-04-28 DIAGNOSIS — O99213 Obesity complicating pregnancy, third trimester: Secondary | ICD-10-CM

## 2023-04-28 DIAGNOSIS — O2233 Deep phlebothrombosis in pregnancy, third trimester: Secondary | ICD-10-CM

## 2023-04-28 DIAGNOSIS — O09892 Supervision of other high risk pregnancies, second trimester: Secondary | ICD-10-CM | POA: Insufficient documentation

## 2023-04-28 DIAGNOSIS — O09293 Supervision of pregnancy with other poor reproductive or obstetric history, third trimester: Secondary | ICD-10-CM

## 2023-05-02 ENCOUNTER — Inpatient Hospital Stay (HOSPITAL_COMMUNITY)
Admission: EM | Admit: 2023-05-02 | Discharge: 2023-05-03 | Disposition: A | Discharge status: Home / Self Care | Payer: Medicaid Other | Attending: Obstetrics & Gynecology | Admitting: Obstetrics & Gynecology

## 2023-05-02 ENCOUNTER — Encounter (HOSPITAL_COMMUNITY): Payer: Self-pay | Admitting: Obstetrics & Gynecology

## 2023-05-02 DIAGNOSIS — Z3A33 33 weeks gestation of pregnancy: Secondary | ICD-10-CM | POA: Insufficient documentation

## 2023-05-02 DIAGNOSIS — O98513 Other viral diseases complicating pregnancy, third trimester: Secondary | ICD-10-CM | POA: Insufficient documentation

## 2023-05-02 DIAGNOSIS — O99113 Other diseases of the blood and blood-forming organs and certain disorders involving the immune mechanism complicating pregnancy, third trimester: Secondary | ICD-10-CM | POA: Insufficient documentation

## 2023-05-02 DIAGNOSIS — D571 Sickle-cell disease without crisis: Secondary | ICD-10-CM

## 2023-05-02 DIAGNOSIS — B348 Other viral infections of unspecified site: Secondary | ICD-10-CM | POA: Insufficient documentation

## 2023-05-02 DIAGNOSIS — Z86718 Personal history of other venous thrombosis and embolism: Secondary | ICD-10-CM | POA: Diagnosis not present

## 2023-05-02 DIAGNOSIS — D696 Thrombocytopenia, unspecified: Secondary | ICD-10-CM

## 2023-05-02 DIAGNOSIS — O34219 Maternal care for unspecified type scar from previous cesarean delivery: Secondary | ICD-10-CM | POA: Diagnosis not present

## 2023-05-02 DIAGNOSIS — O99013 Anemia complicating pregnancy, third trimester: Secondary | ICD-10-CM | POA: Diagnosis not present

## 2023-05-02 DIAGNOSIS — O09899 Supervision of other high risk pregnancies, unspecified trimester: Secondary | ICD-10-CM

## 2023-05-02 DIAGNOSIS — Z98891 History of uterine scar from previous surgery: Secondary | ICD-10-CM

## 2023-05-02 LAB — COMPREHENSIVE METABOLIC PANEL
ALT: 11 U/L (ref 0–44)
AST: 24 U/L (ref 15–41)
Albumin: 3.3 g/dL — ABNORMAL LOW (ref 3.5–5.0)
Alkaline Phosphatase: 71 U/L (ref 38–126)
Anion gap: 13 (ref 5–15)
BUN: <5 mg/dL — ABNORMAL LOW (ref 6–20)
CO2: 17 mmol/L — ABNORMAL LOW (ref 22–32)
Calcium: 9.1 mg/dL (ref 8.9–10.3)
Chloride: 104 mmol/L (ref 98–111)
Creatinine, Ser: 0.85 mg/dL (ref 0.44–1.00)
GFR, Estimated: >60 mL/min (ref >60)
Glucose, Bld: 93 mg/dL (ref 70–99)
Potassium: 3.6 mmol/L (ref 3.5–5.1)
Sodium: 134 mmol/L — ABNORMAL LOW (ref 135–145)
Total Bilirubin: 2 mg/dL — ABNORMAL HIGH (ref 0.3–1.2)
Total Protein: 7 g/dL (ref 6.5–8.1)

## 2023-05-02 LAB — URINALYSIS, ROUTINE W REFLEX MICROSCOPIC
Bilirubin Urine: NEGATIVE
Glucose, UA: NEGATIVE mg/dL
Hgb urine dipstick: NEGATIVE
Ketones, ur: 80 mg/dL — AB
Leukocytes,Ua: NEGATIVE
Nitrite: NEGATIVE
Protein, ur: NEGATIVE mg/dL
Specific Gravity, Urine: 1.014 (ref 1.005–1.030)
pH: 5 (ref 5.0–8.0)

## 2023-05-02 LAB — RESPIRATORY PANEL BY PCR

## 2023-05-02 LAB — CBC WITH DIFFERENTIAL/PLATELET
Abs Immature Granulocytes: 0.03 10*3/uL (ref 0.00–0.07)
Basophils Absolute: 0 10*3/uL (ref 0.0–0.1)
Basophils Relative: 0 %
Eosinophils Absolute: 0 10*3/uL (ref 0.0–0.5)
Eosinophils Relative: 0 %
HCT: 30.7 % — ABNORMAL LOW (ref 36.0–46.0)
Hemoglobin: 10.9 g/dL — ABNORMAL LOW (ref 12.0–15.0)
Immature Granulocytes: 0 %
Lymphocytes Relative: 5 %
Lymphs Abs: 0.4 10*3/uL — ABNORMAL LOW (ref 0.7–4.0)
MCH: 27.5 pg (ref 26.0–34.0)
MCHC: 35.5 g/dL (ref 30.0–36.0)
MCV: 77.5 fL — ABNORMAL LOW (ref 80.0–100.0)
Monocytes Absolute: 0.4 10*3/uL (ref 0.1–1.0)
Monocytes Relative: 5 %
Neutro Abs: 6.6 10*3/uL (ref 1.7–7.7)
Neutrophils Relative %: 90 %
Platelets: 94 10*3/uL — ABNORMAL LOW (ref 150–400)
RBC: 3.96 MIL/uL (ref 3.87–5.11)
RDW: 14.5 % (ref 11.5–15.5)
WBC: 7.4 10*3/uL (ref 4.0–10.5)
nRBC: 0 % (ref 0.0–0.2)

## 2023-05-02 LAB — LACTIC ACID, PLASMA: Lactic Acid, Venous: 1 mmol/L (ref 0.5–1.9)

## 2023-05-02 MED ORDER — LACTATED RINGERS IV BOLUS: 1000.0000 mL | Freq: Once | INTRAVENOUS | Status: DC

## 2023-05-02 MED ORDER — LACTATED RINGERS IV BOLUS
1000.0000 mL | Freq: Once | INTRAVENOUS | Status: AC
  Administered 2023-05-02: 1000 mL via INTRAVENOUS

## 2023-05-02 NOTE — MAU Provider Note (Signed)
Chief Complaint:  No chief complaint on file.   Event Date/Time   First Provider Initiated Contact with Patient 05/02/23 2147      HPI: Sonya Tapia is a 32 y.o. G3P2002 at [redacted]w[redacted]d by early ultrasound who presents to maternity admissions reporting tightening of bilateral inner thighs and hips aching, with onset 05/01/23, worsening today, 05/02/23, after walking outside for 45 minutes.  She has hx DVT and was concerned about this. She also reports mild respiratory symptoms, congestion, runny nose x 2 days with mild sore throat resolved with lozenges.  There are no other symptoms. She is feel She reports good fetal movement, denies LOF, vaginal bleeding, vaginal itching/burning, urinary symptoms, h/a, dizziness, n/v, or fever/chills.     Location: *** Quality: *** Severity: ***/10 on pain scale Duration: *** Timing: *** Modifying factors: *** Associated signs and symptoms: ***  HPI  Past Medical History: Past Medical History:  Diagnosis Date  . DVT (deep venous thrombosis) (HCC)   . Pregnancy induced hypertension   . Rectus sheath hematoma 06/12/2022  . Sepsis without acute organ dysfunction (HCC)   . Sickle cell anemia (HCC)   . Vitamin D deficiency 07/16/2021    Past obstetric history: OB History  Gravida Para Term Preterm AB Living  3 2 2  0 0 2  SAB IAB Ectopic Multiple Live Births  0 0 0 0 2    # Outcome Date GA Lbr Len/2nd Weight Sex Delivery Anes PTL Lv  3 Current           2 Term 06/07/22 [redacted]w[redacted]d  3440 g M CS-LTranv EPI  LIV     Complications: Uterine rupture  1 Term 03/26/20   3629 g M CS-Unspec  N LIV     Complications: Failure to Progress in First Stage    Past Surgical History: Past Surgical History:  Procedure Laterality Date  . CESAREAN SECTION    . CESAREAN SECTION N/A 06/07/2022   Procedure: CESAREAN SECTION;  Surgeon: Tereso Newcomer, MD;  Location: [facility];  Service: Obstetrics;  Laterality: N/A;    Family History: Family History  Problem  Relation Age of Onset  . Hypertension Mother   . Healthy Father   . Asthma Neg Hx   . Cancer Neg Hx   . Diabetes Neg Hx   . Heart disease Neg Hx     Social History: Social History   Tobacco Use  . Smoking status: Never  . Smokeless tobacco: Never  Vaping Use  . Vaping Use: Never used  Substance Use Topics  . Alcohol use: Not Currently    Comment: not while preg  . Drug use: Never    Allergies: No Known Allergies  Meds:  Medications Prior to Admission  Medication Sig Dispense Refill Last Dose  . enoxaparin (LOVENOX) 40 MG/0.4ML injection Inject 0.4 mLs (40 mg total) into the skin daily for 360 doses. 12 mL 11 05/01/2023  . ferrous sulfate (FERROUSUL) 325 (65 FE) MG tablet Take 1 tablet (325 mg total) by mouth every other day. 60 tablet 1 05/01/2023  . FOLIC ACID PO Take 1 capsule by mouth daily.   05/01/2023  . Prenatal Vit-Fe Fumarate-FA (MULTIVITAMIN-PRENATAL) 27-0.8 MG TABS tablet Take 1 tablet by mouth daily at 12 noon.   05/01/2023  . aspirin 81 MG chewable tablet Chew 1 tablet (81 mg total) by mouth daily. 90 tablet 2   . ergocalciferol (VITAMIN D2) 1.25 MG (50000 UT) capsule Take 50,000 Units by mouth once a week.     Marland Kitchen  norethindrone (MICRONOR) 0.35 MG tablet Take 1 tablet by mouth daily. (Patient not taking: Reported on 03/18/2023)       ROS:  Review of Systems   I have reviewed patient's Past Medical Hx, Surgical Hx, Family Hx, Social Hx, medications and allergies.   Physical Exam  Patient Vitals for the past 24 hrs:  BP Temp Temp src Pulse Resp SpO2 Height Weight  05/02/23 2126 112/69 100.3 F (37.9 C) Oral (!) 135 18 99 % 5\' 4"  (1.626 m) 89.8 kg   Constitutional: Well-developed, well-nourished female in no acute distress.  Cardiovascular: normal rate Respiratory: normal effort GI: Abd soft, non-tender, gravid appropriate for gestational age.  MS: Extremities nontender, no edema, normal ROM Neurologic: Alert and oriented x 4.  GU: Neg CVAT.  PELVIC EXAM:  Cervix pink, visually closed, without lesion, scant white creamy discharge, vaginal walls and external genitalia normal Bimanual exam: Cervix 0/long/high, firm, anterior, neg CMT, uterus nontender, nonenlarged, adnexa without tenderness, enlargement, or mass     FHT:  Baseline *** , moderate variability, accelerations present, no decelerations Contractions: q *** mins   Labs: No results found for this or any previous visit (from the past 24 hour(s)). A/Positive/-- (02/16 1009)  Imaging:  [place] MFM FETAL BPP WO NON STRESS  Result Date: 04/28/2023 ----------------------------------------------------------------------  OBSTETRICS REPORT                       (Signed Final 04/28/2023 10:17 am) ---------------------------------------------------------------------- Patient Info  ID #:       [id number]                          D.O.B.:  1991-02-26 (31 yrs)  Name:       Sonya Tapia                 Visit Date: 04/28/2023 09:37 am ---------------------------------------------------------------------- Performed By  Attending:        Noralee Space MD        Secondary Phy.:   [facility]                                                             [facility]  Performed By:     Anabel Halon          Address:          [address]                    [place]                                                             [place], [place]                                                             [address]  Referred By:      Kenney Houseman  S PRATT          [facility]                    MD                                       [facility]                                                             [facility]  Ref. Address:     [address]                    [place], [place]                    [address] ---------------------------------------------------------------------- Orders  #  Description                           Code        Ordered By  1  [place]  MFM FETAL BPP WO NON               76819.01    RAVI SHANKAR     STRESS  2  [place] MFM OB FOLLOW UP                   60454.09    RAVI [facility] ----------------------------------------------------------------------  #  Order #                     Accession #                Episode #  1  [id number]                   [id number]                 [id number]  2  [id number]                   [id number]                 [id number] ---------------------------------------------------------------------- Indications  Obesity complicating pregnancy, third          O99.213  trimester (BMI 30)  Maternal sickle cell anemia, third trimester   O99.013, D57.1  Uterine scar, other than C/S (rupture)         O34.29  Deep vein thrombosis (DVT) Lovenox             O22.30  Thrombocytopenia affecting pregnancy,          O99.119, D69.6  antepartum  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 Cord Insertion:      Previously visualized  Amniotic Fluid  AFI FV:      Within normal limits  AFI Sum(cm)     %Tile       Largest Pocket(cm)  19.01           71          7.64  RUQ(cm)       RLQ(cm)       LUQ(cm)        LLQ(cm)  7.64          4.03          4.78           2.56 ---------------------------------------------------------------------- Biophysical Evaluation  Amniotic F.V:   Pocket => 2 cm             F. Tone:        Observed  F. Movement:    Observed                   Score:           8/8  F. Breathing:   Observed ---------------------------------------------------------------------- Biometry  BPD:      85.4  mm     G. Age:  34w 3d         91  %    CI:        76.87   %    70 - 86                                                          FL/HC:      21.0   %    19.1 - 21.3  HC:      308.5  mm     G. Age:  34w 3d         67  %    HC/AC:      1.01        0.96 - 1.17  AC:      304.5  mm     G. Age:  34w 3d         94  %    FL/BPD:     76.0   %    71 - 87  FL:       64.9  mm     G. Age:  33w 3d         67  %    FL/AC:      21.3   %  20 - 24  Est. FW:    2362  gm      5 lb 3 oz     89  % ---------------------------------------------------------------------- OB History  Blood Type:   A+  Gravidity:    3         Term:   2  Living:       2 ---------------------------------------------------------------------- Gestational Age  LMP:           35w 0d        Date:  08/26/22                 EDD:   06/02/23  U/S Today:     34w 1d                                        EDD:   06/08/23  Best:          Armida Sans 3d     Det. By:  U/S  (12/23/22)          EDD:   06/20/23 ---------------------------------------------------------------------- Anatomy  Cranium:               Appears normal         LVOT:                   Previously seen  Cavum:                 Previously seen        Aortic Arch:            Previously seen  Ventricles:            Previously seen        Ductal Arch:            Previously seen  Choroid Plexus:        Previously seen        Diaphragm:              Appears normal  Cerebellum:            Previously seen        Stomach:                Appears normal, left                                                                        sided  Posterior Fossa:       Previously seen        Abdomen:                Previously seen  Nuchal Fold:           Previously seen        Abdominal Wall:         Previously seen  Face:                  Orbits and profile     Cord Vessels:           Previously seen  previously seen  Lips:                  Previously seen        Kidneys:                Appear normal  Palate:                Previously             Bladder:                Appears normal                         visualized  Thoracic:              Previously seen        Spine:                  Previously seen  Heart:                 Previously seen        Upper Extremities:      Previously seen  RVOT:                  Previously seen        Lower Extremities:      Previously seen  Other:  Female gender, Heels/feet, open hands/5th digits, lenses, 3VV, 3VTV          visualized previously. Nasal bone, VC prev visualized. ---------------------------------------------------------------------- Cervix Uterus Adnexa  Cervix  Not visualized (advanced GA >24wks)  Uterus  No abnormality visualized. ---------------------------------------------------------------------- Impression  -Sickle cell anemia.  No pain crises.  -History of DVT.  Patient takes low molecular weight heparin  prophylaxis.  -History of uterine scar rupture in her second pregnancy after  induction of labor.  Patient does not have gestational diabetes.  Blood pressure  today at our office is 116/71 mmHg.  Fetal growth is appropriate for gestational age.  Abdominal  circumference measurement is at the 94th percentile.  Amniotic fluid is normal good fetal activity seen.  Cephalic  presentation.  Antenatal testing is reassuring. ---------------------------------------------------------------------- Recommendations  - Continue weekly BPP till delivery.  -Repeat cesarean delivery at [redacted] weeks gestation or if patient  presents with labor. ----------------------------------------------------------------------                 Noralee Space, MD Electronically Signed Final Report   04/28/2023 10:17 am ----------------------------------------------------------------------  [place] MFM OB FOLLOW UP  Result Date:  04/28/2023 ----------------------------------------------------------------------  OBSTETRICS REPORT                       (Signed Final 04/28/2023 10:17 am) ---------------------------------------------------------------------- Patient Info  ID #:       [id number]                          D.O.B.:  1991/03/09 (31 yrs)  Name:       Sonya Tapia                 Visit Date: 04/28/2023 09:37 am ---------------------------------------------------------------------- Performed By  Attending:        Noralee Space MD        Secondary Phy.:   [facility]  Performed By:     Anabel Halon          Address:          [address]                    [place]                                                             [place], [place]                                                             [address]  Referred By:      Reva Bores          Location:         [facility]                    MD                                       [facility]                                                             [facility]  Ref. Address:     [address]                    [place], [place]                    [address] ---------------------------------------------------------------------- Orders  #  Description                           Code        Ordered By  1  [place] MFM FETAL BPP WO NON               76819.01    RAVI SHANKAR     STRESS  2  [place] MFM OB FOLLOW UP                   57846.96    RAVI SHANKAR ----------------------------------------------------------------------  #  Order #                     Accession #                Episode #  1  [id number]  [id number]                 [id number]  2  [id number]                   [id number]                 [id number] ---------------------------------------------------------------------- Indications  Obesity complicating pregnancy, third           O99.213  trimester (BMI 30)  Maternal sickle cell anemia, third trimester   O99.013, D57.1  Uterine scar, other than C/S (rupture)         O34.29  Deep vein thrombosis (DVT) Lovenox             O22.30  Thrombocytopenia affecting pregnancy,          O99.119, D69.6  antepartum  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)            Z14.8  Short interval between pregancies, 3rd         O09.893  trimester  [redacted] weeks gestation of pregnancy                Z3A.32  History of cesarean delivery, currently        O34.219  pregnant  Poor obstetric history: Previous gestational   O09.299  HTN  Encounter for other antenatal screening        Z36.2  follow-up  LR NIPS/neg AFP ---------------------------------------------------------------------- Fetal Evaluation  Num Of Fetuses:         1  Fetal Heart Rate(bpm):  138  Cardiac Activity:       Observed  Presentation:           Cephalic  Placenta:               Anterior  P. Cord Insertion:      Previously visualized  Amniotic Fluid  AFI FV:      Within normal limits  AFI Sum(cm)     %Tile       Largest Pocket(cm)  19.01           71          7.64  RUQ(cm)       RLQ(cm)       LUQ(cm)        LLQ(cm)  7.64          4.03          4.78           2.56 ---------------------------------------------------------------------- Biophysical Evaluation  Amniotic F.V:   Pocket => 2 cm             F. Tone:        Observed  F. Movement:    Observed                   Score:          8/8  F. Breathing:   Observed ---------------------------------------------------------------------- Biometry  BPD:      85.4  mm     G. Age:  34w 3d         91  %    CI:        76.87   %    70 - 86  FL/HC:      21.0   %    19.1 - 21.3  HC:      308.5  mm     G. Age:  34w 3d         67  %    HC/AC:      1.01        0.96 - 1.17  AC:      304.5  mm     G. Age:  34w 3d         94  %    FL/BPD:     76.0   %    71 - 87  FL:       64.9  mm     G. Age:  33w 3d         67  %    FL/AC:       21.3   %    20 - 24  Est. FW:    2362  gm      5 lb 3 oz     89  % ---------------------------------------------------------------------- OB History  Blood Type:   A+  Gravidity:    3         Term:   2  Living:       2 ---------------------------------------------------------------------- Gestational Age  LMP:           35w 0d        Date:  08/26/22                 EDD:   06/02/23  U/S Today:     34w 1d                                        EDD:   06/08/23  Best:          32w 3d     Det. By:  U/S  (12/23/22)          EDD:   06/20/23 ---------------------------------------------------------------------- Anatomy  Cranium:               Appears normal         LVOT:                   Previously seen  Cavum:                 Previously seen        Aortic Arch:            Previously seen  Ventricles:            Previously seen        Ductal Arch:            Previously seen  Choroid Plexus:        Previously seen        Diaphragm:              Appears normal  Cerebellum:            Previously seen        Stomach:                Appears normal, left  sided  Posterior Fossa:       Previously seen        Abdomen:                Previously seen  Nuchal Fold:           Previously seen        Abdominal Wall:         Previously seen  Face:                  Orbits and profile     Cord Vessels:           Previously seen                         previously seen  Lips:                  Previously seen        Kidneys:                Appear normal  Palate:                Previously             Bladder:                Appears normal                         visualized  Thoracic:              Previously seen        Spine:                  Previously seen  Heart:                 Previously seen        Upper Extremities:      Previously seen  RVOT:                  Previously seen        Lower Extremities:      Previously seen  Other:  Female gender, Heels/feet, open  hands/5th digits, lenses, 3VV, 3VTV          visualized previously. Nasal bone, VC prev visualized. ---------------------------------------------------------------------- Cervix Uterus Adnexa  Cervix  Not visualized (advanced GA >24wks)  Uterus  No abnormality visualized. ---------------------------------------------------------------------- Impression  -Sickle cell anemia.  No pain crises.  -History of DVT.  Patient takes low molecular weight heparin  prophylaxis.  -History of uterine scar rupture in her second pregnancy after  induction of labor.  Patient does not have gestational diabetes.  Blood pressure  today at our office is 116/71 mmHg.  Fetal growth is appropriate for gestational age.  Abdominal  circumference measurement is at the 94th percentile.  Amniotic fluid is normal good fetal activity seen.  Cephalic  presentation.  Antenatal testing is reassuring. ---------------------------------------------------------------------- Recommendations  - Continue weekly BPP till delivery.  -Repeat cesarean delivery at [redacted] weeks gestation or if patient  presents with labor. ----------------------------------------------------------------------                 Noralee Space, MD Electronically Signed Final Report   04/28/2023 10:17 am ----------------------------------------------------------------------   MAU Course/MDM: Orders Placed This Encounter  Procedures  . Respiratory (~20 pathogens) panel by PCR  . Urinalysis, Routine w reflex microscopic -Urine, Clean Catch  . CBC with Differential/Platelet  .  Comprehensive metabolic panel  . Lactic acid, plasma  . Droplet precaution    Meds ordered this encounter  Medications  . lactated ringers bolus 1,000 mL  . lactated ringers bolus 1,000 mL     NST reviewed Consult *** with presentation, exam findings and test results.  Treatments in MAU included ***.   Pt discharge with strict *** precautions.    Assessment: 1. Sickle-HgbC anemia of mother during  pregnancy (HCC)   2. Previous cesarean section   3. Short interval between pregnancies affecting pregnancy, antepartum   4. Thrombocytopenia (HCC)   5. History of DVT (deep vein thrombosis)   6. [redacted] weeks gestation of pregnancy     Plan: Discharge home Labor precautions and fetal kick counts  Allergies as of 05/02/2023   No Known Allergies   Med Rec must be completed prior to using this [facility]***       Sharen Counter Certified Nurse-Midwife 05/02/2023 9:48 PM

## 2023-05-02 NOTE — MAU Note (Signed)
.  Sonya Tapia is a 32 y.o. at [redacted]w[redacted]d here in MAU reporting: tightness in both thighs since last pm. Pain goes away when she is walking. Has a history of blood clot in her calf and so she was concerned. Denies abd pain or bleeding. Reports positive fetal movement. Pt reports that they are moving and her lovenox has been packed up so she has only had one dose in the last week and a half. Denies shortness of breath.  Onset of complaint: yesterday.  Pain score: 3/10 Vitals:   05/02/23 2126  BP: 112/69  Pulse: (!) 135  Resp: 18  Temp: 100.3 F (37.9 C)  SpO2: 99%     FHT:170 Lab orders placed from triage:

## 2023-05-03 DIAGNOSIS — Z98891 History of uterine scar from previous surgery: Secondary | ICD-10-CM

## 2023-05-03 DIAGNOSIS — Z86718 Personal history of other venous thrombosis and embolism: Secondary | ICD-10-CM

## 2023-05-03 DIAGNOSIS — B348 Other viral infections of unspecified site: Secondary | ICD-10-CM

## 2023-05-03 DIAGNOSIS — D696 Thrombocytopenia, unspecified: Secondary | ICD-10-CM

## 2023-05-03 DIAGNOSIS — O99013 Anemia complicating pregnancy, third trimester: Secondary | ICD-10-CM

## 2023-05-03 DIAGNOSIS — Z3A33 33 weeks gestation of pregnancy: Secondary | ICD-10-CM

## 2023-05-03 DIAGNOSIS — D571 Sickle-cell disease without crisis: Secondary | ICD-10-CM

## 2023-05-03 MED ORDER — LACTATED RINGERS IV BOLUS
1000.0000 mL | Freq: Once | INTRAVENOUS | Status: AC
  Administered 2023-05-03: 1000 mL via INTRAVENOUS

## 2023-05-03 MED ORDER — ACETAMINOPHEN 500 MG PO TABS
1000.0000 mg | ORAL_TABLET | Freq: Once | ORAL | Status: AC
  Administered 2023-05-03: 1000 mg via ORAL
  Filled 2023-05-03: qty 2

## 2023-05-05 ENCOUNTER — Ambulatory Visit: Payer: Medicaid Other

## 2023-05-05 ENCOUNTER — Ambulatory Visit: Payer: Medicaid Other | Attending: Obstetrics and Gynecology | Admitting: *Deleted

## 2023-05-05 ENCOUNTER — Ambulatory Visit: Payer: Medicaid Other | Admitting: *Deleted

## 2023-05-05 VITALS — BP 115/70 | HR 78

## 2023-05-05 DIAGNOSIS — Z3A33 33 weeks gestation of pregnancy: Secondary | ICD-10-CM | POA: Diagnosis not present

## 2023-05-05 DIAGNOSIS — O99213 Obesity complicating pregnancy, third trimester: Secondary | ICD-10-CM | POA: Insufficient documentation

## 2023-05-05 DIAGNOSIS — O09293 Supervision of pregnancy with other poor reproductive or obstetric history, third trimester: Secondary | ICD-10-CM

## 2023-05-05 DIAGNOSIS — O0990 Supervision of high risk pregnancy, unspecified, unspecified trimester: Secondary | ICD-10-CM

## 2023-05-05 NOTE — Procedures (Signed)
Sonya Tapia 19-Nov-1990 [redacted]w[redacted]d  Fetus A Non-Stress Test Interpretation for 05/05/23  Indication:  Obesity  Fetal Heart Rate A Mode: External Baseline Rate (A): 130 bpm Variability: Moderate Accelerations: 15 x 15 Decelerations: None Multiple birth?: No  Uterine Activity Mode: Palpation, Toco Contraction Frequency (min): Occas Contraction Quality: Mild Resting Tone Palpated: Relaxed Resting Time: Adequate  Interpretation (Fetal Testing) Nonstress Test Interpretation: Reactive Comments: Dr. Parke Poisson reviewed tracing.

## 2023-05-06 ENCOUNTER — Ambulatory Visit (INDEPENDENT_AMBULATORY_CARE_PROVIDER_SITE_OTHER): Payer: Medicaid Other | Admitting: Obstetrics and Gynecology

## 2023-05-06 ENCOUNTER — Other Ambulatory Visit: Payer: Self-pay

## 2023-05-06 VITALS — BP 109/83 | HR 83 | Wt 199.0 lb

## 2023-05-06 DIAGNOSIS — O09899 Supervision of other high risk pregnancies, unspecified trimester: Secondary | ICD-10-CM

## 2023-05-06 DIAGNOSIS — O0993 Supervision of high risk pregnancy, unspecified, third trimester: Secondary | ICD-10-CM

## 2023-05-06 DIAGNOSIS — Z3A33 33 weeks gestation of pregnancy: Secondary | ICD-10-CM

## 2023-05-06 DIAGNOSIS — D696 Thrombocytopenia, unspecified: Secondary | ICD-10-CM

## 2023-05-06 DIAGNOSIS — S3769XD Other injury of uterus, subsequent encounter: Secondary | ICD-10-CM

## 2023-05-06 DIAGNOSIS — O99013 Anemia complicating pregnancy, third trimester: Secondary | ICD-10-CM

## 2023-05-06 DIAGNOSIS — O09893 Supervision of other high risk pregnancies, third trimester: Secondary | ICD-10-CM

## 2023-05-06 DIAGNOSIS — Z98891 History of uterine scar from previous surgery: Secondary | ICD-10-CM

## 2023-05-06 DIAGNOSIS — D571 Sickle-cell disease without crisis: Secondary | ICD-10-CM

## 2023-05-06 DIAGNOSIS — O0990 Supervision of high risk pregnancy, unspecified, unspecified trimester: Secondary | ICD-10-CM

## 2023-05-06 DIAGNOSIS — O99213 Obesity complicating pregnancy, third trimester: Secondary | ICD-10-CM

## 2023-05-06 NOTE — Progress Notes (Signed)
   PRENATAL VISIT NOTE  Subjective:  Sonya Tapia is a 32 y.o. G3P2002 at [redacted]w[redacted]d being seen today for ongoing prenatal care.  She is currently monitored for the following issues for this high-risk pregnancy and has Sickle-HgbC anemia of mother during pregnancy [facility]); Previous cesarean section; [place] disease (HCC); Alpha thalassemia silent carrier; History of DVT (deep vein thrombosis); History of gestational hypertension; Hx Uterine rupture; Thrombocytopenia (HCC); Hydronephrosis with ureteropelvic junction (UPJ) obstruction; Supervision of high risk pregnancy, antepartum; Short interval between pregnancies affecting pregnancy, antepartum; and Obesity affecting pregnancy on their problem list.  Patient doing well with no acute concerns today. She reports no complaints.  Contractions: Not present.  .  Movement: Present. Denies leaking of fluid.   Pt was recently seen in MAU, she was concerned regarding leg/thigh stiffness and concern for DVT.  Pt was cleared.  Last platelets were 94.  The following portions of the patient's history were reviewed and updated as appropriate: allergies, current medications, past family history, past medical history, past social history, past surgical history and problem list. Problem list updated.  Objective:   Vitals:   05/06/23 1619  BP: 109/83  Pulse: 83  Weight: 199 lb (90.3 kg)    Fetal Status: Fetal Heart Rate (bpm): 144 Fundal Height: 34 cm Movement: Present     General:  Alert, oriented and cooperative. Patient is in no acute distress.  Skin: Skin is warm and dry. No rash noted.   Cardiovascular: Normal heart rate noted  Respiratory: Normal respiratory effort, no problems with respiration noted  Abdomen: Soft, gravid, appropriate for gestational age.  Pain/Pressure: Absent     Pelvic: Cervical exam deferred        Extremities: Normal range of motion.  Edema: None  Mental Status:  Normal mood and affect. Normal behavior. Normal judgment and thought  content.   Assessment and Plan:  Pregnancy: G3P2002 at [redacted]w[redacted]d  1. [redacted] weeks gestation of pregnancy   2. Rupture of uterus, subsequent encounter Pt scheduled for cesarean section at 37 weeks, weekly testing until delivery  3. Thrombocytopenia (HCC) Plts 94, consider rechecking at 36 weeks  4. Supervision of high risk pregnancy, antepartum Continue routine prenatal care  5. Sickle-HgbC anemia of mother during pregnancy (HCC)   6. Short interval between pregnancies affecting pregnancy, antepartum   7. Previous cesarean section Repeat cesarean section at 37 weeks  8. Obesity affecting pregnancy in third trimester, unspecified obesity type   Preterm labor symptoms and general obstetric precautions including but not limited to vaginal bleeding, contractions, leaking of fluid and fetal movement were reviewed in detail with the patient.  Please refer to After Visit Summary for other counseling recommendations.   Return in about 2 weeks (around 05/20/2023) for 36 weeks swabs, HOB, in person.   Mariel Aloe, MD Faculty Attending Center for [facility]

## 2023-05-12 ENCOUNTER — Ambulatory Visit: Payer: Medicaid Other | Attending: Obstetrics and Gynecology

## 2023-05-12 ENCOUNTER — Ambulatory Visit: Payer: Medicaid Other | Admitting: *Deleted

## 2023-05-12 VITALS — BP 103/73 | HR 97

## 2023-05-12 DIAGNOSIS — D696 Thrombocytopenia, unspecified: Secondary | ICD-10-CM

## 2023-05-12 DIAGNOSIS — E669 Obesity, unspecified: Secondary | ICD-10-CM

## 2023-05-12 DIAGNOSIS — O99013 Anemia complicating pregnancy, third trimester: Secondary | ICD-10-CM

## 2023-05-12 DIAGNOSIS — O0990 Supervision of high risk pregnancy, unspecified, unspecified trimester: Secondary | ICD-10-CM | POA: Diagnosis present

## 2023-05-12 DIAGNOSIS — O99213 Obesity complicating pregnancy, third trimester: Secondary | ICD-10-CM | POA: Diagnosis present

## 2023-05-12 DIAGNOSIS — O3429 Maternal care due to uterine scar from other previous surgery: Secondary | ICD-10-CM | POA: Diagnosis not present

## 2023-05-12 DIAGNOSIS — O285 Abnormal chromosomal and genetic finding on antenatal screening of mother: Secondary | ICD-10-CM

## 2023-05-12 DIAGNOSIS — D571 Sickle-cell disease without crisis: Secondary | ICD-10-CM

## 2023-05-12 DIAGNOSIS — Z3A34 34 weeks gestation of pregnancy: Secondary | ICD-10-CM

## 2023-05-12 DIAGNOSIS — D563 Thalassemia minor: Secondary | ICD-10-CM

## 2023-05-12 DIAGNOSIS — Z7901 Long term (current) use of anticoagulants: Secondary | ICD-10-CM

## 2023-05-12 DIAGNOSIS — O99113 Other diseases of the blood and blood-forming organs and certain disorders involving the immune mechanism complicating pregnancy, third trimester: Secondary | ICD-10-CM | POA: Diagnosis not present

## 2023-05-12 DIAGNOSIS — Z86718 Personal history of other venous thrombosis and embolism: Secondary | ICD-10-CM

## 2023-05-19 ENCOUNTER — Ambulatory Visit: Payer: Medicaid Other | Attending: Obstetrics and Gynecology | Admitting: *Deleted

## 2023-05-19 ENCOUNTER — Ambulatory Visit: Payer: Medicaid Other

## 2023-05-19 ENCOUNTER — Encounter: Payer: Self-pay | Admitting: *Deleted

## 2023-05-19 ENCOUNTER — Ambulatory Visit (HOSPITAL_BASED_OUTPATIENT_CLINIC_OR_DEPARTMENT_OTHER): Payer: Medicaid Other | Admitting: *Deleted

## 2023-05-19 VITALS — BP 115/71 | HR 89

## 2023-05-19 DIAGNOSIS — O26893 Other specified pregnancy related conditions, third trimester: Secondary | ICD-10-CM | POA: Diagnosis present

## 2023-05-19 DIAGNOSIS — O99213 Obesity complicating pregnancy, third trimester: Secondary | ICD-10-CM

## 2023-05-19 DIAGNOSIS — O0990 Supervision of high risk pregnancy, unspecified, unspecified trimester: Secondary | ICD-10-CM

## 2023-05-19 DIAGNOSIS — Z3A35 35 weeks gestation of pregnancy: Secondary | ICD-10-CM | POA: Diagnosis not present

## 2023-05-19 DIAGNOSIS — E669 Obesity, unspecified: Secondary | ICD-10-CM

## 2023-05-19 NOTE — Procedures (Signed)
Sonya Tapia September 13, 1991 [redacted]w[redacted]d  Fetus A Non-Stress Test Interpretation for 05/19/23  Indication:  preg obe  Fetal Heart Rate A Mode: External Baseline Rate (A): 140 bpm Variability: Moderate Accelerations: 15 x 15 Decelerations: None Multiple birth?: No  Uterine Activity Mode: Toco Contraction Frequency (min): none Resting Tone Palpated: Relaxed Resting Time: Adequatepre  Interpretation (Fetal Testing) Nonstress Test Interpretation: Reactive Overall Impression: Reassuring for gestational age Comments: Tracing reviewed byDr. Grace Bushy

## 2023-05-20 ENCOUNTER — Other Ambulatory Visit: Payer: Self-pay

## 2023-05-20 ENCOUNTER — Ambulatory Visit (INDEPENDENT_AMBULATORY_CARE_PROVIDER_SITE_OTHER): Payer: Medicaid Other | Admitting: Obstetrics & Gynecology

## 2023-05-20 VITALS — BP 112/75 | HR 89 | Wt 198.5 lb

## 2023-05-20 DIAGNOSIS — D571 Sickle-cell disease without crisis: Secondary | ICD-10-CM

## 2023-05-20 DIAGNOSIS — Z8759 Personal history of other complications of pregnancy, childbirth and the puerperium: Secondary | ICD-10-CM

## 2023-05-20 DIAGNOSIS — O0990 Supervision of high risk pregnancy, unspecified, unspecified trimester: Secondary | ICD-10-CM

## 2023-05-20 DIAGNOSIS — Z3A35 35 weeks gestation of pregnancy: Secondary | ICD-10-CM

## 2023-05-20 DIAGNOSIS — O99019 Anemia complicating pregnancy, unspecified trimester: Secondary | ICD-10-CM

## 2023-05-20 DIAGNOSIS — O99013 Anemia complicating pregnancy, third trimester: Secondary | ICD-10-CM

## 2023-05-20 DIAGNOSIS — O0993 Supervision of high risk pregnancy, unspecified, third trimester: Secondary | ICD-10-CM

## 2023-05-20 DIAGNOSIS — Z98891 History of uterine scar from previous surgery: Secondary | ICD-10-CM

## 2023-05-20 DIAGNOSIS — S3769XD Other injury of uterus, subsequent encounter: Secondary | ICD-10-CM

## 2023-05-20 DIAGNOSIS — Z86718 Personal history of other venous thrombosis and embolism: Secondary | ICD-10-CM

## 2023-05-20 NOTE — Progress Notes (Signed)
   PRENATAL VISIT NOTE  Subjective:  Sonya Tapia is a 32 y.o. G3P2002 at [redacted]w[redacted]d being seen today for ongoing prenatal care.  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  Patient reports no complaints.  Contractions: Not present. Vag. Bleeding: None.  Movement: Present. Denies leaking of fluid.   The following portions of the patient's history were reviewed and updated as appropriate: allergies, current medications, past family history, past medical history, past social history, past surgical history and problem list.   Objective:   Vitals:   05/20/23 1002  BP: 112/75  Pulse: 89  Weight: 198 lb 8 oz (90 kg)    Fetal Status: Fetal Heart Rate (bpm): 135   Movement: Present     General:  Alert, oriented and cooperative. Patient is in no acute distress.  Skin: Skin is warm and dry. No rash noted.   Cardiovascular: Normal heart rate noted  Respiratory: Normal respiratory effort, no problems with respiration noted  Abdomen: Soft, gravid, appropriate for gestational age.  Pain/Pressure: Absent     Pelvic: Cervical exam deferred        Extremities: Normal range of motion.     Mental Status: Normal mood and affect. Normal behavior. Normal judgment and thought content.   Assessment and Plan:  Pregnancy: G3P2002 at [redacted]w[redacted]d There are no diagnoses linked to this encounter. Preterm labor symptoms and general obstetric precautions including but not limited to vaginal bleeding,  contractions, leaking of fluid and fetal movement were reviewed in detail with the patient. Please refer to After Visit Summary for other counseling recommendations.  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  Future Appointments  Date Time Provider Department Center  05/26/2023  4:15 PM Reva Bores, MD [facility]  05/27/2023  8:30 AM [facility] NURSE [facility]  05/27/2023  8:45 AM [facility] US5 [facility]    Scheryl Darter, MD

## 2023-05-25 ENCOUNTER — Encounter (HOSPITAL_COMMUNITY): Payer: Self-pay

## 2023-05-25 NOTE — Patient Instructions (Signed)
Sonya Tapia  05/25/2023   Your procedure is scheduled on:  06/05/2023  Arrive at 1030 at Entrance C on [address] at [facility]  and [employer]. You are invited to use the FREE valet parking or use the Visitor's parking deck.  Pick up the phone at the desk and dial [phone].  Call this number if you have problems the morning of surgery: [phone]  Remember:   Do not eat food:(After Midnight) Desps de medianoche.  Do not drink clear liquids: (4 Hours before arrival) 4 horas ante llegada.  Take these medicines the morning of surgery with A SIP OF WATER:  Take lovenox night dose no later than 1000 the night before your surgery   Do not wear jewelry, make-up or nail polish.  Do not wear lotions, powders, or perfumes. Do not wear deodorant.  Do not shave 48 hours prior to surgery.  Do not bring valuables to the hospital.  [facility] is not   responsible for any belongings or valuables brought to the hospital.  Contacts, dentures or bridgework may not be worn into surgery.  Leave suitcase in the car. After surgery it may be brought to your room.  For patients admitted to the hospital, checkout time is 11:00 AM the day of              discharge.      Please read over the following fact sheets that you were given:     Preparing for Surgery

## 2023-05-26 ENCOUNTER — Ambulatory Visit (INDEPENDENT_AMBULATORY_CARE_PROVIDER_SITE_OTHER): Payer: Medicaid Other | Admitting: Family Medicine

## 2023-05-26 ENCOUNTER — Other Ambulatory Visit: Payer: Self-pay

## 2023-05-26 ENCOUNTER — Other Ambulatory Visit (HOSPITAL_COMMUNITY)
Admission: RE | Admit: 2023-05-26 | Discharge: 2023-05-26 | Disposition: A | Discharge status: Home / Self Care | Payer: Medicaid Other | Source: Ambulatory Visit | Attending: Family Medicine | Admitting: Family Medicine

## 2023-05-26 VITALS — BP 113/79 | HR 91 | Wt 199.6 lb

## 2023-05-26 DIAGNOSIS — O0993 Supervision of high risk pregnancy, unspecified, third trimester: Secondary | ICD-10-CM

## 2023-05-26 DIAGNOSIS — Z86718 Personal history of other venous thrombosis and embolism: Secondary | ICD-10-CM

## 2023-05-26 DIAGNOSIS — Z3A36 36 weeks gestation of pregnancy: Secondary | ICD-10-CM

## 2023-05-26 DIAGNOSIS — O0990 Supervision of high risk pregnancy, unspecified, unspecified trimester: Secondary | ICD-10-CM | POA: Insufficient documentation

## 2023-05-26 DIAGNOSIS — D571 Sickle-cell disease without crisis: Secondary | ICD-10-CM

## 2023-05-26 DIAGNOSIS — Z98891 History of uterine scar from previous surgery: Secondary | ICD-10-CM

## 2023-05-26 DIAGNOSIS — O99013 Anemia complicating pregnancy, third trimester: Secondary | ICD-10-CM

## 2023-05-26 DIAGNOSIS — S3769XD Other injury of uterus, subsequent encounter: Secondary | ICD-10-CM

## 2023-05-26 NOTE — Progress Notes (Signed)
   PRENATAL VISIT NOTE  Subjective:  Sonya Tapia is a 32 y.o. G3P2002 at [redacted]w[redacted]d being seen today for ongoing prenatal care.  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  Patient reports no complaints.  Contractions: Not present. Vag. Bleeding: None.  Movement: Present. Denies leaking of fluid.   The following portions of the patient's history were reviewed and updated as appropriate: allergies, current medications, past family history, past medical history, past social history, past surgical history and problem list.   Objective:   Vitals:   05/26/23 1641  BP: 113/79  Pulse: 91  Weight: 199 lb 9.6 oz (90.5 kg)    Fetal Status: Fetal Heart Rate (bpm): 138 Fundal Height: 35 cm Movement: Present  Presentation: Vertex  General:  Alert, oriented and cooperative. Patient is in no acute distress.  Skin: Skin is warm and dry. No rash noted.   Cardiovascular: Normal heart rate noted  Respiratory: Normal respiratory effort, no problems with respiration noted  Abdomen: Soft, gravid, appropriate for gestational age.  Pain/Pressure: Absent     Pelvic: Cervical exam performed in the presence of a chaperone        Extremities: Normal range of motion.  Edema: None  Mental Status: Normal mood and affect. Normal behavior. Normal judgment and thought content.   Assessment and Plan:  Pregnancy: G3P2002 at [redacted]w[redacted]d 1. Supervision of high risk pregnancy, antepartum Cultures today -  Cervicovaginal ancillary only - Culture, beta strep (group b only)  2. Rupture of uterus, subsequent encounter For [facility]  3. Previous cesarean section   4. History of DVT (2020 during covid) On Lovenox  5. 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 Please refer to After Visit Summary for other counseling recommendations.   Return in 1 week (on 06/02/2023).  Future Appointments  Date Time Provider Department Center  05/27/2023  8:30 AM [facility] NURSE [facility]  05/27/2023  8:45 AM [facility] US5 [facility]  06/02/2023 10:35 AM Adam Phenix, MD [facility]  06/03/2023  9:00 AM [facility] PAT 1 [facility] None    Reva Bores, MD

## 2023-05-27 ENCOUNTER — Ambulatory Visit: Payer: Medicaid Other | Attending: Obstetrics and Gynecology

## 2023-05-27 ENCOUNTER — Ambulatory Visit: Payer: Medicaid Other | Admitting: *Deleted

## 2023-05-27 ENCOUNTER — Other Ambulatory Visit: Payer: Self-pay | Admitting: Obstetrics and Gynecology

## 2023-05-27 VITALS — BP 124/77 | HR 79

## 2023-05-27 DIAGNOSIS — O09899 Supervision of other high risk pregnancies, unspecified trimester: Secondary | ICD-10-CM

## 2023-05-27 DIAGNOSIS — D571 Sickle-cell disease without crisis: Secondary | ICD-10-CM

## 2023-05-27 DIAGNOSIS — O0990 Supervision of high risk pregnancy, unspecified, unspecified trimester: Secondary | ICD-10-CM | POA: Diagnosis present

## 2023-05-27 DIAGNOSIS — D563 Thalassemia minor: Secondary | ICD-10-CM | POA: Diagnosis not present

## 2023-05-27 DIAGNOSIS — Z86718 Personal history of other venous thrombosis and embolism: Secondary | ICD-10-CM | POA: Insufficient documentation

## 2023-05-27 DIAGNOSIS — Z87828 Personal history of other (healed) physical injury and trauma: Secondary | ICD-10-CM

## 2023-05-27 DIAGNOSIS — O99013 Anemia complicating pregnancy, third trimester: Secondary | ICD-10-CM | POA: Diagnosis not present

## 2023-05-27 DIAGNOSIS — D696 Thrombocytopenia, unspecified: Secondary | ICD-10-CM | POA: Insufficient documentation

## 2023-05-27 DIAGNOSIS — O09293 Supervision of pregnancy with other poor reproductive or obstetric history, third trimester: Secondary | ICD-10-CM

## 2023-05-27 DIAGNOSIS — Z3A36 36 weeks gestation of pregnancy: Secondary | ICD-10-CM

## 2023-05-27 DIAGNOSIS — O99213 Obesity complicating pregnancy, third trimester: Secondary | ICD-10-CM | POA: Diagnosis not present

## 2023-05-27 DIAGNOSIS — Z8759 Personal history of other complications of pregnancy, childbirth and the puerperium: Secondary | ICD-10-CM | POA: Diagnosis present

## 2023-05-27 DIAGNOSIS — O34219 Maternal care for unspecified type scar from previous cesarean delivery: Secondary | ICD-10-CM

## 2023-05-27 DIAGNOSIS — O99113 Other diseases of the blood and blood-forming organs and certain disorders involving the immune mechanism complicating pregnancy, third trimester: Secondary | ICD-10-CM

## 2023-05-27 DIAGNOSIS — O99119 Other diseases of the blood and blood-forming organs and certain disorders involving the immune mechanism complicating pregnancy, unspecified trimester: Secondary | ICD-10-CM | POA: Insufficient documentation

## 2023-05-27 DIAGNOSIS — O2233 Deep phlebothrombosis in pregnancy, third trimester: Secondary | ICD-10-CM | POA: Diagnosis not present

## 2023-05-27 DIAGNOSIS — O3429 Maternal care due to uterine scar from other previous surgery: Secondary | ICD-10-CM

## 2023-05-27 DIAGNOSIS — E669 Obesity, unspecified: Secondary | ICD-10-CM

## 2023-05-27 NOTE — Procedures (Signed)
Sonya Tapia 1991-06-17 [redacted]w[redacted]d  Fetus A Non-Stress Test Interpretation for 05/27/23--NST with BPP  Indication: Unsatisfactory BPP  Fetal Heart Rate A Mode: External Baseline Rate (A): 135 bpm Variability: Moderate Accelerations: 15 x 15 Decelerations: None Multiple birth?: No  Uterine Activity Mode: Toco Contraction Frequency (min): occas Contraction Duration (sec): 80 Contraction Quality: Mild Resting Tone Palpated: Relaxed  Interpretation (Fetal Testing) Nonstress Test Interpretation: Reactive Overall Impression: Reassuring for gestational age Comments: Tracing reviewed byDr. Judeth Cornfield

## 2023-05-28 LAB — CERVICOVAGINAL ANCILLARY ONLY
Chlamydia: NEGATIVE
Comment: NEGATIVE
Comment: NORMAL
Neisseria Gonorrhea: NEGATIVE

## 2023-05-31 LAB — CULTURE, BETA STREP (GROUP B ONLY): Strep Gp B Culture: NEGATIVE

## 2023-06-01 ENCOUNTER — Other Ambulatory Visit: Payer: Self-pay | Admitting: Obstetrics & Gynecology

## 2023-06-01 NOTE — Anesthesia Preprocedure Evaluation (Addendum)
Anesthesia Evaluation  Patient identified by MRN, date of birth, ID band Patient awake    Reviewed: Allergy & Precautions, NPO status , Patient's Chart, lab work & pertinent test results  Airway Mallampati: II  TM Distance: >3 FB Neck ROM: Full    Dental no notable dental hx.    Pulmonary neg pulmonary ROS   Pulmonary exam normal breath sounds clear to auscultation       Cardiovascular hypertension, + DVT (DVT 2020, on lovenox- LD:36h ago)  Normal cardiovascular exam Rhythm:Regular Rate:Normal     Neuro/Psych negative neurological ROS  negative psych ROS   GI/Hepatic negative GI ROS, Neg liver ROS,,,  Endo/Other  Obesity BMI 34  Renal/GU negative Renal ROS  negative genitourinary   Musculoskeletal negative musculoskeletal ROS (+)    Abdominal  (+) + obese  Peds  Hematology  (+) Blood dyscrasia, Sickle cell anemia and anemia Hb 9.7, plt 80   Anesthesia Other Findings   Reproductive/Obstetrics (+) Pregnancy Last section 13yr ago IOL for gHTN w/ subsequent uterine rupture- emergent, under epidural                             Anesthesia Physical Anesthesia Plan  ASA: 3  Anesthesia Plan: Spinal   Post-op Pain Management: Regional block, Toradol IV (intra-op)* and Ofirmev IV (intra-op)*   Induction:   PONV Risk Score and Plan: 3 and Ondansetron, Dexamethasone and Treatment may vary due to age or medical condition  Airway Management Planned: Natural Airway and Nasal Cannula  Additional Equipment: None  Intra-op Plan:   Post-operative Plan:   Informed Consent: I have reviewed the patients History and Physical, chart, labs and discussed the procedure including the risks, benefits and alternatives for the proposed anesthesia with the patient or authorized representative who has indicated his/her understanding and acceptance.       Plan Discussed with: CRNA  Anesthesia Plan  Comments: (Hx uterine rupture- cross x 2 Known gestational thrombocytopenia, plt today came back at 51- will give TXA and 1 unit plt pre-incision given hx uterine rupture requiring transfusion last pregnancy, c section x 3, sickle cell)       Anesthesia Quick Evaluation

## 2023-06-02 ENCOUNTER — Encounter: Payer: Medicaid Other | Admitting: Obstetrics & Gynecology

## 2023-06-03 ENCOUNTER — Encounter (HOSPITAL_COMMUNITY)
Admission: RE | Admit: 2023-06-03 | Discharge: 2023-06-03 | Disposition: A | Discharge status: Home / Self Care | Payer: Medicaid Other | Source: Ambulatory Visit | Attending: Obstetrics & Gynecology | Admitting: Obstetrics & Gynecology

## 2023-06-03 DIAGNOSIS — O99113 Other diseases of the blood and blood-forming organs and certain disorders involving the immune mechanism complicating pregnancy, third trimester: Secondary | ICD-10-CM | POA: Diagnosis not present

## 2023-06-03 DIAGNOSIS — Z3A37 37 weeks gestation of pregnancy: Secondary | ICD-10-CM | POA: Diagnosis not present

## 2023-06-03 DIAGNOSIS — O99891 Other specified diseases and conditions complicating pregnancy: Secondary | ICD-10-CM | POA: Diagnosis not present

## 2023-06-03 DIAGNOSIS — Z98891 History of uterine scar from previous surgery: Secondary | ICD-10-CM | POA: Diagnosis present

## 2023-06-03 DIAGNOSIS — Z86718 Personal history of other venous thrombosis and embolism: Secondary | ICD-10-CM | POA: Diagnosis not present

## 2023-06-03 DIAGNOSIS — O99013 Anemia complicating pregnancy, third trimester: Secondary | ICD-10-CM | POA: Diagnosis not present

## 2023-06-03 LAB — RPR: RPR Ser Ql: NONREACTIVE

## 2023-06-05 ENCOUNTER — Other Ambulatory Visit: Payer: Self-pay

## 2023-06-05 ENCOUNTER — Inpatient Hospital Stay (HOSPITAL_COMMUNITY): Payer: Medicaid Other | Admitting: Anesthesiology

## 2023-06-05 ENCOUNTER — Encounter (HOSPITAL_COMMUNITY): Payer: Self-pay | Admitting: Obstetrics & Gynecology

## 2023-06-05 ENCOUNTER — Encounter (HOSPITAL_COMMUNITY)
Admission: RE | Disposition: A | Discharge status: Home / Self Care | Payer: Self-pay | Source: Home / Self Care | Attending: Obstetrics & Gynecology

## 2023-06-05 ENCOUNTER — Inpatient Hospital Stay (HOSPITAL_COMMUNITY)
Admission: RE | Admit: 2023-06-05 | Discharge: 2023-06-07 | DRG: 786 | Disposition: A | Discharge status: Home / Self Care | Payer: Medicaid Other | Attending: Obstetrics & Gynecology | Admitting: Obstetrics & Gynecology

## 2023-06-05 DIAGNOSIS — Z7901 Long term (current) use of anticoagulants: Secondary | ICD-10-CM | POA: Diagnosis not present

## 2023-06-05 DIAGNOSIS — Z975 Presence of (intrauterine) contraceptive device: Secondary | ICD-10-CM

## 2023-06-05 DIAGNOSIS — Z3A37 37 weeks gestation of pregnancy: Secondary | ICD-10-CM

## 2023-06-05 DIAGNOSIS — O99214 Obesity complicating childbirth: Secondary | ICD-10-CM | POA: Diagnosis present

## 2023-06-05 DIAGNOSIS — D571 Sickle-cell disease without crisis: Secondary | ICD-10-CM | POA: Diagnosis present

## 2023-06-05 DIAGNOSIS — O0990 Supervision of high risk pregnancy, unspecified, unspecified trimester: Secondary | ICD-10-CM

## 2023-06-05 DIAGNOSIS — Z30430 Encounter for insertion of intrauterine contraceptive device: Secondary | ICD-10-CM

## 2023-06-05 DIAGNOSIS — O34219 Maternal care for unspecified type scar from previous cesarean delivery: Secondary | ICD-10-CM | POA: Diagnosis not present

## 2023-06-05 DIAGNOSIS — O9912 Other diseases of the blood and blood-forming organs and certain disorders involving the immune mechanism complicating childbirth: Secondary | ICD-10-CM | POA: Diagnosis not present

## 2023-06-05 DIAGNOSIS — Z98891 History of uterine scar from previous surgery: Secondary | ICD-10-CM

## 2023-06-05 DIAGNOSIS — Z148 Genetic carrier of other disease: Secondary | ICD-10-CM | POA: Diagnosis not present

## 2023-06-05 DIAGNOSIS — Z8759 Personal history of other complications of pregnancy, childbirth and the puerperium: Secondary | ICD-10-CM

## 2023-06-05 DIAGNOSIS — O9902 Anemia complicating childbirth: Secondary | ICD-10-CM | POA: Diagnosis present

## 2023-06-05 DIAGNOSIS — O34211 Maternal care for low transverse scar from previous cesarean delivery: Principal | ICD-10-CM | POA: Diagnosis present

## 2023-06-05 DIAGNOSIS — S3769XA Other injury of uterus, initial encounter: Secondary | ICD-10-CM | POA: Diagnosis present

## 2023-06-05 DIAGNOSIS — I10 Essential (primary) hypertension: Secondary | ICD-10-CM | POA: Diagnosis not present

## 2023-06-05 DIAGNOSIS — D696 Thrombocytopenia, unspecified: Secondary | ICD-10-CM | POA: Diagnosis present

## 2023-06-05 DIAGNOSIS — Z86718 Personal history of other venous thrombosis and embolism: Secondary | ICD-10-CM

## 2023-06-05 DIAGNOSIS — O99019 Anemia complicating pregnancy, unspecified trimester: Secondary | ICD-10-CM | POA: Diagnosis present

## 2023-06-05 DIAGNOSIS — D5720 Sickle-cell/Hb-C disease without crisis: Secondary | ICD-10-CM | POA: Diagnosis present

## 2023-06-05 DIAGNOSIS — D563 Thalassemia minor: Secondary | ICD-10-CM | POA: Diagnosis present

## 2023-06-05 HISTORY — PX: INTRAUTERINE DEVICE (IUD) INSERTION: SHX5877

## 2023-06-05 LAB — PREPARE RBC (CROSSMATCH)

## 2023-06-05 LAB — CBC
HCT: 27.8 % — ABNORMAL LOW (ref 36.0–46.0)
Hemoglobin: 9.7 g/dL — ABNORMAL LOW (ref 12.0–15.0)
MCH: 28.7 pg (ref 26.0–34.0)
MCHC: 34.9 g/dL (ref 30.0–36.0)
MCV: 82.2 fL (ref 80.0–100.0)
Platelets: 80 10*3/uL — ABNORMAL LOW (ref 150–400)
RBC: 3.38 MIL/uL — ABNORMAL LOW (ref 3.87–5.11)
RDW: 14.5 % (ref 11.5–15.5)
WBC: 6.6 10*3/uL (ref 4.0–10.5)
nRBC: 0 % (ref 0.0–0.2)

## 2023-06-05 LAB — BPAM PLATELET PHERESIS: Unit Type and Rh: 6200

## 2023-06-05 LAB — BPAM RBC
Blood Product Expiration Date: 202408142359
Blood Product Expiration Date: 202408222359
Unit Type and Rh: 6200
Unit Type and Rh: 6200

## 2023-06-05 LAB — TYPE AND SCREEN

## 2023-06-05 LAB — PREPARE PLATELET PHERESIS

## 2023-06-05 SURGERY — Surgical Case
Anesthesia: Spinal | Site: Cervix

## 2023-06-05 MED ORDER — LEVONORGESTREL 20 MCG/DAY IU IUD
1.0000 | INTRAUTERINE_SYSTEM | INTRAUTERINE | Status: AC
  Administered 2023-06-05: 1 via INTRAUTERINE

## 2023-06-05 MED ORDER — SIMETHICONE 80 MG PO CHEW
80.0000 mg | CHEWABLE_TABLET | ORAL | Status: DC | PRN
  Filled 2023-06-05: qty 1

## 2023-06-05 MED ORDER — TRANEXAMIC ACID-NACL 1000-0.7 MG/100ML-% IV SOLN
INTRAVENOUS | Status: AC
  Filled 2023-06-05: qty 100

## 2023-06-05 MED ORDER — OXYCODONE HCL 5 MG PO TABS: 5.0000 mg | ORAL_TABLET | ORAL | Status: DC | PRN

## 2023-06-05 MED ORDER — KETOROLAC TROMETHAMINE 30 MG/ML IJ SOLN: 30.0000 mg | Freq: Four times a day (QID) | INTRAMUSCULAR | Status: DC | PRN

## 2023-06-05 MED ORDER — ONDANSETRON HCL 4 MG/2ML IJ SOLN
INTRAMUSCULAR | Status: DC | PRN
  Administered 2023-06-05: 4 mg via INTRAVENOUS

## 2023-06-05 MED ORDER — CEFAZOLIN SODIUM-DEXTROSE 2-4 GM/100ML-% IV SOLN
2.0000 g | INTRAVENOUS | Status: AC
  Administered 2023-06-05: 2 g via INTRAVENOUS

## 2023-06-05 MED ORDER — MORPHINE SULFATE (PF) 0.5 MG/ML IJ SOLN
INTRAMUSCULAR | Status: DC | PRN
  Administered 2023-06-05: .15 mg via INTRATHECAL

## 2023-06-05 MED ORDER — DEXAMETHASONE SODIUM PHOSPHATE 10 MG/ML IJ SOLN
INTRAMUSCULAR | Status: AC
  Filled 2023-06-05: qty 1

## 2023-06-05 MED ORDER — CELECOXIB 200 MG PO CAPS
ORAL_CAPSULE | ORAL | Status: AC
  Filled 2023-06-05: qty 2

## 2023-06-05 MED ORDER — SCOPOLAMINE 1 MG/3DAYS TD PT72
1.0000 | MEDICATED_PATCH | Freq: Once | TRANSDERMAL | Status: DC
  Administered 2023-06-05: 1.5 mg via TRANSDERMAL

## 2023-06-05 MED ORDER — STERILE WATER FOR IRRIGATION IR SOLN
Status: DC | PRN
  Administered 2023-06-05: 1000 mL

## 2023-06-05 MED ORDER — VITAMIN K1 1 MG/0.5ML IJ SOLN
INTRAMUSCULAR | Status: AC
  Filled 2023-06-05: qty 0.5

## 2023-06-05 MED ORDER — IBUPROFEN 600 MG PO TABS: 600.0000 mg | ORAL_TABLET | Freq: Four times a day (QID) | ORAL | Status: DC

## 2023-06-05 MED ORDER — PHENYLEPHRINE HCL-NACL 20-0.9 MG/250ML-% IV SOLN
INTRAVENOUS | Status: AC
  Filled 2023-06-05: qty 250

## 2023-06-05 MED ORDER — SODIUM CHLORIDE 0.9 % IV SOLN: INTRAVENOUS | Status: DC | PRN

## 2023-06-05 MED ORDER — OXYTOCIN-SODIUM CHLORIDE 30-0.9 UT/500ML-% IV SOLN: 2.5000 [IU]/h | INTRAVENOUS | Status: AC

## 2023-06-05 MED ORDER — BUPIVACAINE IN DEXTROSE 0.75-8.25 % IT SOLN
INTRATHECAL | Status: DC | PRN
  Administered 2023-06-05: 1.6 mL via INTRATHECAL

## 2023-06-05 MED ORDER — KETOROLAC TROMETHAMINE 30 MG/ML IJ SOLN: 30.0000 mg | Freq: Four times a day (QID) | INTRAMUSCULAR | Status: DC

## 2023-06-05 MED ORDER — DIPHENHYDRAMINE HCL 50 MG/ML IJ SOLN: 12.5000 mg | INTRAMUSCULAR | Status: DC | PRN

## 2023-06-05 MED ORDER — MENTHOL 3 MG MT LOZG: 1.0000 | LOZENGE | OROMUCOSAL | Status: DC | PRN

## 2023-06-05 MED ORDER — LACTATED RINGERS IV SOLN: INTRAVENOUS | Status: DC

## 2023-06-05 MED ORDER — GABAPENTIN 100 MG PO CAPS
200.0000 mg | ORAL_CAPSULE | Freq: Every day | ORAL | Status: DC
  Administered 2023-06-05 – 2023-06-06 (×2): 200 mg via ORAL
  Filled 2023-06-05 (×2): qty 2

## 2023-06-05 MED ORDER — SODIUM CHLORIDE 0.9 % IV SOLN
12.5000 mg | Freq: Once | INTRAVENOUS | Status: DC
  Filled 2023-06-05: qty 0.5

## 2023-06-05 MED ORDER — PROMETHAZINE HCL 25 MG/ML IJ SOLN
INTRAMUSCULAR | Status: AC
  Filled 2023-06-05: qty 1

## 2023-06-05 MED ORDER — TRANEXAMIC ACID-NACL 1000-0.7 MG/100ML-% IV SOLN
1000.0000 mg | Freq: Once | INTRAVENOUS | Status: AC
  Administered 2023-06-05: 1000 mg via INTRAVENOUS

## 2023-06-05 MED ORDER — NALOXONE HCL 0.4 MG/ML IJ SOLN: 0.4000 mg | INTRAMUSCULAR | Status: DC | PRN

## 2023-06-05 MED ORDER — GABAPENTIN 300 MG PO CAPS
300.0000 mg | ORAL_CAPSULE | ORAL | Status: AC
  Administered 2023-06-05: 300 mg via ORAL

## 2023-06-05 MED ORDER — SENNOSIDES-DOCUSATE SODIUM 8.6-50 MG PO TABS
2.0000 | ORAL_TABLET | Freq: Every day | ORAL | Status: DC
  Administered 2023-06-06 – 2023-06-07 (×2): 2 via ORAL
  Filled 2023-06-05 (×2): qty 2

## 2023-06-05 MED ORDER — ERYTHROMYCIN 5 MG/GM OP OINT
TOPICAL_OINTMENT | OPHTHALMIC | Status: AC
  Filled 2023-06-05: qty 1

## 2023-06-05 MED ORDER — OXYTOCIN-SODIUM CHLORIDE 30-0.9 UT/500ML-% IV SOLN
INTRAVENOUS | Status: DC | PRN
  Administered 2023-06-05: 30 [IU] via INTRAVENOUS

## 2023-06-05 MED ORDER — ENOXAPARIN SODIUM 40 MG/0.4ML IJ SOSY
40.0000 mg | PREFILLED_SYRINGE | INTRAMUSCULAR | Status: DC
  Administered 2023-06-06 – 2023-06-07 (×2): 40 mg via SUBCUTANEOUS
  Filled 2023-06-05 (×2): qty 0.4

## 2023-06-05 MED ORDER — CELECOXIB 200 MG PO CAPS
400.0000 mg | ORAL_CAPSULE | ORAL | Status: AC
  Administered 2023-06-05: 400 mg via ORAL

## 2023-06-05 MED ORDER — METHYLERGONOVINE MALEATE 0.2 MG/ML IJ SOLN
INTRAMUSCULAR | Status: DC | PRN
  Administered 2023-06-05: .2 mg via INTRAMUSCULAR

## 2023-06-05 MED ORDER — CEFAZOLIN SODIUM-DEXTROSE 2-4 GM/100ML-% IV SOLN
INTRAVENOUS | Status: AC
  Filled 2023-06-05: qty 100

## 2023-06-05 MED ORDER — POVIDONE-IODINE 10 % EX SWAB: 2.0000 | Freq: Once | CUTANEOUS | Status: DC

## 2023-06-05 MED ORDER — DIPHENHYDRAMINE HCL 25 MG PO CAPS: 25.0000 mg | ORAL_CAPSULE | ORAL | Status: DC | PRN

## 2023-06-05 MED ORDER — SIMETHICONE 80 MG PO CHEW
80.0000 mg | CHEWABLE_TABLET | Freq: Three times a day (TID) | ORAL | Status: DC
  Administered 2023-06-06 – 2023-06-07 (×5): 80 mg via ORAL
  Filled 2023-06-05 (×4): qty 1

## 2023-06-05 MED ORDER — FENTANYL CITRATE (PF) 100 MCG/2ML IJ SOLN
INTRAMUSCULAR | Status: DC | PRN
  Administered 2023-06-05: 15 ug via INTRATHECAL

## 2023-06-05 MED ORDER — COCONUT OIL OIL: 1.0000 | TOPICAL_OIL | Status: DC | PRN

## 2023-06-05 MED ORDER — ACETAMINOPHEN 500 MG PO TABS
1000.0000 mg | ORAL_TABLET | Freq: Four times a day (QID) | ORAL | Status: AC
  Administered 2023-06-05 – 2023-06-06 (×3): 1000 mg via ORAL
  Filled 2023-06-05 (×3): qty 2

## 2023-06-05 MED ORDER — ONDANSETRON HCL 4 MG/2ML IJ SOLN: 4.0000 mg | Freq: Three times a day (TID) | INTRAMUSCULAR | Status: DC | PRN

## 2023-06-05 MED ORDER — FENTANYL CITRATE (PF) 100 MCG/2ML IJ SOLN
INTRAMUSCULAR | Status: AC
  Filled 2023-06-05: qty 2

## 2023-06-05 MED ORDER — NALOXONE HCL 4 MG/10ML IJ SOLN: 1.0000 ug/kg/h | INTRAVENOUS | Status: DC | PRN

## 2023-06-05 MED ORDER — SCOPOLAMINE 1 MG/3DAYS TD PT72
MEDICATED_PATCH | TRANSDERMAL | Status: AC
  Filled 2023-06-05: qty 1

## 2023-06-05 MED ORDER — SODIUM CHLORIDE 0.9% IV SOLUTION: Freq: Once | INTRAVENOUS | Status: DC

## 2023-06-05 MED ORDER — PHENYLEPHRINE HCL-NACL 20-0.9 MG/250ML-% IV SOLN
INTRAVENOUS | Status: DC | PRN
  Administered 2023-06-05: 60 ug/min via INTRAVENOUS

## 2023-06-05 MED ORDER — PRENATAL MULTIVITAMIN CH
1.0000 | ORAL_TABLET | Freq: Every day | ORAL | Status: DC
  Administered 2023-06-06 – 2023-06-07 (×2): 1 via ORAL
  Filled 2023-06-05 (×2): qty 1

## 2023-06-05 MED ORDER — SODIUM CHLORIDE 0.9% FLUSH: 3.0000 mL | INTRAVENOUS | Status: DC | PRN

## 2023-06-05 MED ORDER — MORPHINE SULFATE (PF) 0.5 MG/ML IJ SOLN
INTRAMUSCULAR | Status: AC
  Filled 2023-06-05: qty 10

## 2023-06-05 MED ORDER — DEXAMETHASONE SODIUM PHOSPHATE 10 MG/ML IJ SOLN
INTRAMUSCULAR | Status: DC | PRN
  Administered 2023-06-05: 4 mg via INTRAVENOUS

## 2023-06-05 MED ORDER — GABAPENTIN 300 MG PO CAPS
ORAL_CAPSULE | ORAL | Status: AC
  Filled 2023-06-05: qty 1

## 2023-06-05 MED ORDER — OXYTOCIN-SODIUM CHLORIDE 30-0.9 UT/500ML-% IV SOLN
INTRAVENOUS | Status: AC
  Filled 2023-06-05: qty 500

## 2023-06-05 MED ORDER — ONDANSETRON HCL 4 MG/2ML IJ SOLN
INTRAMUSCULAR | Status: AC
  Filled 2023-06-05: qty 2

## 2023-06-05 MED ORDER — LEVONORGESTREL 20 MCG/DAY IU IUD
INTRAUTERINE_SYSTEM | INTRAUTERINE | Status: AC
  Filled 2023-06-05: qty 1

## 2023-06-05 MED ORDER — ZOLPIDEM TARTRATE 5 MG PO TABS: 5.0000 mg | ORAL_TABLET | Freq: Every evening | ORAL | Status: DC | PRN

## 2023-06-05 SURGICAL SUPPLY — 32 items
APL PRP STRL LF DISP 70% ISPRP (MISCELLANEOUS) ×4
CHLORAPREP W/TINT 26 (MISCELLANEOUS) ×4 IMPLANT
CLAMP UMBILICAL CORD (MISCELLANEOUS) ×2 IMPLANT
CLOTH BEACON ORANGE TIMEOUT ST (SAFETY) ×2 IMPLANT
DRAIN JACKSON PRT FLT 7MM (DRAIN) IMPLANT
DRSG OPSITE POSTOP 4X10 (GAUZE/BANDAGES/DRESSINGS) ×2 IMPLANT
ELECT REM PT RETURN 9FT ADLT (ELECTROSURGICAL) ×2
ELECTRODE REM PT RTRN 9FT ADLT (ELECTROSURGICAL) ×2 IMPLANT
EVACUATOR SILICONE 100CC (DRAIN) IMPLANT
EXTRACTOR VACUUM M CUP 4 TUBE (SUCTIONS) IMPLANT
GAUZE PAD ABD 7.5X8 STRL (GAUZE/BANDAGES/DRESSINGS) IMPLANT
GAUZE SPONGE 4X4 12PLY STRL LF (GAUZE/BANDAGES/DRESSINGS) IMPLANT
GLOVE BIO SURGEON STRL SZ7 (GLOVE) ×2 IMPLANT
GLOVE BIOGEL PI IND STRL 7.0 (GLOVE) ×4 IMPLANT
GOWN STRL REUS W/TWL LRG LVL3 (GOWN DISPOSABLE) ×4 IMPLANT
HEMOSTAT ARISTA ABSORB 3G PWDR (HEMOSTASIS) IMPLANT
KIT ABG SYR 3ML LUER SLIP (SYRINGE) IMPLANT
MAT PREVALON FULL STRYKER (MISCELLANEOUS) IMPLANT
NDL HYPO 25X5/8 SAFETYGLIDE (NEEDLE) ×2 IMPLANT
NEEDLE HYPO 25X5/8 SAFETYGLIDE (NEEDLE) ×2 IMPLANT
NS IRRIG 1000ML POUR BTL (IV SOLUTION) ×2 IMPLANT
PACK C SECTION WH (CUSTOM PROCEDURE TRAY) ×2 IMPLANT
PAD OB MATERNITY 4.3X12.25 (PERSONAL CARE ITEMS) ×2 IMPLANT
RTRCTR C-SECT PINK 25CM LRG (MISCELLANEOUS) ×2 IMPLANT
SUT MNCRL 0 VIOLET CTX 36 (SUTURE) IMPLANT
SUT VIC AB 0 CTX 36 (SUTURE) ×10
SUT VIC AB 0 CTX36XBRD ANBCTRL (SUTURE) ×10 IMPLANT
SUT VIC AB 4-0 KS 27 (SUTURE) ×2 IMPLANT
SYR KIT LINE DRAW 1CC W/FILTR (LINER) ×2 IMPLANT
TOWEL OR 17X24 6PK STRL BLUE (TOWEL DISPOSABLE) ×2 IMPLANT
TRAY FOLEY W/BAG SLVR 14FR LF (SET/KITS/TRAYS/PACK) ×2 IMPLANT
WATER STERILE IRR 1000ML POUR (IV SOLUTION) ×2 IMPLANT

## 2023-06-05 NOTE — Op Note (Addendum)
Sonya Tapia PROCEDURE DATE: 06/05/2023  PREOPERATIVE DIAGNOSIS: Intrauterine pregnancy at  [redacted]w[redacted]d weeks gestation  POSTOPERATIVE DIAGNOSIS: Intrauterine pregnancy at [redacted]w[redacted]d with 5 cm uterine window (unlabored uterus)  PROCEDURE:    Low Transverse Cesarean Section  SURGEON:  Dr. Elsie Lincoln  ASSISTANT:   INDICATIONS: Sonya Tapia is a 32 y.o. G3P3003 at [redacted]w[redacted]d.  The risks of cesarean section discussed with the patient included but were not limited to: bleeding which may require transfusion or reoperation; infection which may require antibiotics; injury to bowel, bladder, ureters or other surrounding organs; injury to the fetus; need for additional procedures including hysterectomy in the event of a life-threatening hemorrhage; placental abnormalities wth subsequent pregnancies, incisional problems, thromboembolic phenomenon and other postoperative/anesthesia complications. The patient concurred with the proposed plan, giving informed written consent for the procedure.    FINDINGS:  Viable female  infant in cephalic presentation, APGAR (1 MIN): 8  APGAR (5 MINS): 9; weight: 3260g, clear amniotic fluid. Intact placenta, three vessel cord. Moderate amount of adhesion noted between the fascia and the underlying rectus muscle. Grossly normal uterus, with 5 cm uterine window noted, with fetal hair visible; grossly normal right ovary and fallopian tube. Left fallopian tube appeared adherent to the posterior aspect of the uterus and the left ovary was not easily visible .   ANESTHESIA: Spinal  ESTIMATED BLOOD LOSS: 570cc  SPECIMENS: Placenta sent to L&D  COMPLICATIONS: None immediate  PROCEDURE IN DETAIL:  The patient received intravenous antibiotics and had sequential compression devices applied to her lower extremities.  Spinal anesthesia was administered and was found to be adequate. She was then placed in a dorsal supine position with a leftward tilt, and prepped and draped in a sterile  manner.  A foley catheter was placed into her bladder and attached to constant gravity.  After an adequate timeout was performed, a Pfannenstiel skin incision was made with scalpel and carried through to the underlying layer of fascia. The fascia was incised in the midline and this incision was extended bilaterally using the Mayo scissors. Kocher clamps were applied to the superior aspect of the fascial incision and the underlying rectus muscles were dissected off bluntly. A similar process was carried out on the inferior aspect of the facial incision. The rectus muscles were separated in the midline bluntly and the peritoneum was entered bluntly. An alexis retractor was placed to aid with visualization. A transverse hysterotomy was made with a scalpel and extended bilaterally bluntly. The infant was successfully delivered, and cord was clamped and cut and infant was handed over to awaiting neonatology team. Uterine massage was then administered and the placenta delivered intact with three-vessel cord. The uterus was cleared of clot and debris.  The hysterotomy was closed with 0 vicryl in a single layer and hemostasis was observed on all surfaces. The peritoneum and rectus muscles were noted to be hemostatic.  The fascia was closed with 0-Vicryl in a running fashion with good restoration of anatomy.  The subcutaneus tissue was copiously irrigated and closed using 2-0 plan gut. The skin was closed with 4-0 Vicryl in a subcuticular fashion.    Pt tolerated the procedure will.  All counts were correct x2.  Pt went to the recovery room in stable condition.  Sheppard Evens MD MPH OB Fellow, Faculty Practice [facility], Center for [facility] 06/05/2023     Attestation of Attending Supervision of Fellow: Evaluation and management procedures were performed by the Fellow under my supervision and collaboration. I have reviewed the  Fellow's note and chart, and I agree with the management and plan.  Elsie Lincoln, MD

## 2023-06-05 NOTE — Progress Notes (Signed)
OBOR Charge called patient at 0730 explained to her about the system been down. Patient asked if she should stay NPO RN said yes and her provider will reach back to her at [phone] with more information.

## 2023-06-05 NOTE — Transfer of Care (Signed)
Immediate Anesthesia Transfer of Care Note  Patient: Sonya Tapia  Procedure(s) Performed: CESAREAN SECTION INTRAUTERINE DEVICE (IUD) INSERTION (Cervix)  Patient Location: PACU  Anesthesia Type:Spinal  Level of Consciousness: awake, alert , and oriented  Airway & Oxygen Therapy: Patient Spontanous Breathing  Post-op Assessment: Report given to RN and Post -op Vital signs reviewed and stable  Post vital signs: Reviewed and stable  Last Vitals:  Vitals Value Taken Time  BP 116/84 06/05/23 1535  Temp    Pulse 88 06/05/23 1541  Resp 24 06/05/23 1541  SpO2 98 % 06/05/23 1541  Vitals shown include unfiled device data.  Last Pain:  Vitals:   06/05/23 1233  TempSrc: Oral         Complications: No notable events documented.

## 2023-06-05 NOTE — Progress Notes (Signed)
ANTICOAGULATION CONSULT NOTE - Initial Consult  Pharmacy Consult for Lovenox Indication: VTE prophylaxis   No Known Allergies  Patient Measurements: Height: 5\' 4"  (162.6 cm) Weight: 90.5 kg (199 lb 8 oz) IBW/kg (Calculated) : 54.7   Vital Signs: Temp: 98.4 F (36.9 C) (07/19 1825) Temp Source: Oral (07/19 1825) BP: 116/79 (07/19 1825) Pulse Rate: 83 (07/19 1825)  Labs: Recent Labs    06/05/23 1250  HGB 9.7*  HCT 27.8*  PLT 80*    CrCl cannot be calculated (Patient's most recent lab result is older than the maximum 21 days allowed.).   Medical History: Past Medical History:  Diagnosis Date   DVT (deep venous thrombosis) (HCC)    Pregnancy induced hypertension    Rectus sheath hematoma 06/12/2022   Sepsis without acute organ dysfunction (HCC)    Sickle cell anemia (HCC)    Uterine rupture during labor    Vitamin D deficiency 07/16/2021    Medications:  Post-Csection orders  Assessment: 32 yo admitted for LTCS at [redacted]w[redacted]d with h/o DVT on OCPs and thrombocytopenia during pregnancy. Pt was on Lovenox 40mg  sq daily during pregnancy with last dose > 48 hours ago. Goal of Therapy:  VTE prophylaxis  Monitor platelets by anticoagulation protocol: Yes   Plan:  Will continue Lovenox 40mg  sq daily as previously prescribed with next dose due ~ 0600 7/20 (~ 14 hours after end of anesthesia).   Claybon Jabs 06/05/2023,7:16 PM

## 2023-06-05 NOTE — Discharge Summary (Signed)
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 [facility])   Previous cesarean section   Buckshot disease (HCC)   Alpha thalassemia silent carrier   History of DVT (2020 during covid)   History of gestational hypertension   Hx Uterine rupture   Thrombocytopenia (HCC)   Supervision of high risk pregnancy, antepartum   IUD (intrauterine device) in place   Uterine rupture during labor  Additional problems: ***    Discharge diagnosis: Term Pregnancy Delivered, Anemia, and Thrombocytopenia                                               Post partum procedures:{Postpartum procedures:23558} Augmentation: N/A Complications: None  Hospital course: Sceduled C/S   32 y.o. yo G3P3003 at [redacted]w[redacted]d was admitted to the hospital 06/05/2023 for scheduled cesarean section with the following indication:Elective Repeat due to history of C-section X 2 and uterine rupture. Delivery details are as follows:  Membrane Rupture Time/Date: 2:42 PM,06/05/2023  Delivery Method:C-Section, Low Transverse Details of operation can be found in separate operative note.  Patient had a postpartum course complicated by***.  She is ambulating, tolerating a regular diet, passing flatus, and urinating well. Patient is discharged home in stable condition on  06/05/23        Newborn Data: Birth date:06/05/2023 Birth time:2:42 PM Gender:Female Living status:Living Apgars:8 ,9  Weight:3260 g    Magnesium Sulfate received: {Mag  received:30440022} BMZ received: No Rhophylac:N/A MMR:N/A T-DaP:Given prenatally Flu: {WUJ:81191} Transfusion:{Transfusion received:30440034}  Physical exam  Vitals:   06/05/23 1630 06/05/23 1645 06/05/23 1700 06/05/23 1720  BP: (!) 124/91 (!) 126/90 (!) 116/90 (!) 120/90  Pulse: 85 77 73 71  Resp: 17 16 18 18   Temp:    98.5 F (36.9 C)  TempSrc:    Oral  SpO2: 99% 100% 97% 100%  Weight:      Height:       General: {Exam; general:21111117} Lochia: {Desc; appropriate/inappropriate:30686::"appropriate"} Uterine Fundus: {Desc; firm/soft:30687} Incision: {Exam; incision:21111123} DVT Evaluation: {Exam; dvt:2111122} Labs: Lab Results  Component Value Date   WBC 6.6 06/05/2023   HGB 9.7 (L) 06/05/2023   HCT 27.8 (L) 06/05/2023   MCV 82.2 06/05/2023   PLT 80 (L) 06/05/2023      Latest Ref Rng & Units 05/02/2023    9:58 PM  CMP  Glucose 70 - 99 mg/dL 93   BUN 6 - 20 mg/dL <5   Creatinine 4.78 - 1.00 mg/dL 2.95   Sodium 621 - 308 mmol/L 134   Potassium 3.5 - 5.1 mmol/L 3.6   Chloride 98 - 111 mmol/L 104   CO2 22 - 32 mmol/L 17   Calcium 8.9 - 10.3 mg/dL 9.1   Total Protein 6.5 - 8.1 g/dL 7.0   Total Bilirubin 0.3 - 1.2 mg/dL 2.0   Alkaline Phos 38 - 126 U/L 71   AST 15 - 41 U/L 24   ALT 0 - 44 U/L  11    Edinburgh Score:    07/24/2022    1:23 PM  Edinburgh Postnatal Depression Scale Screening Tool  I have been able to laugh and see the funny side of things. 0  I have looked forward with enjoyment to things. 0  I have blamed myself unnecessarily when things went wrong. 0  I have been anxious or worried for no good reason. 0  I have felt scared or panicky for no good reason. 0  Things have been getting on top of me. 0  I have been so unhappy that I have had difficulty sleeping. 0  I have felt sad or miserable. 0  I have been so unhappy that I have been crying. 0  The thought of harming myself has occurred to me. 0  Edinburgh Postnatal Depression Scale Total 0      After visit meds:  Allergies as of 06/05/2023   No Known Allergies   Med Rec must be completed prior to using this [facility]***        Discharge home in stable condition Infant Feeding: {Baby feeding:23562} Infant Disposition:{CHL IP OB HOME WITH YQMVHQ:46962} Discharge instruction: per After Visit Summary and Postpartum booklet. Activity: Advance as tolerated. Pelvic rest for 6 weeks.  Diet: {OB XBMW:41324401} Future Appointments:No future appointments. Follow up Visit:  The following message was sent to [facility].  Please schedule this patient for a In person postpartum visit in 6 weeks with the following provider: MD Additional Postpartum F/U:Incision check 1 week and BP check 1 week  High risk pregnancy complicated by:  history of DVT, on lovenox; history of uterine rupture, prior C-section X 2 Delivery mode:  C-Section, Low Transverse Anticipated Birth Control:  PP IUD placed   06/05/2023 Fernando Stoiber Lizabeth Leyden, MD

## 2023-06-05 NOTE — Progress Notes (Signed)
MDA asked  OR charge to inform patient to take in clear liquids- gatorade, broth till she is informed to stay NPO. Patient verbalized understanding

## 2023-06-05 NOTE — H&P (Signed)
Sonya Tapia is a 32 y.o. female G3P2002 at 37 weeks 3 days presenting for Repeat c/s secondary to uterine rupture during TOLAC in 2023.  . OB History     Gravida  3   Para  2   Term  2   Preterm  0   AB  0   Living  2      SAB  0   IAB  0   Ectopic  0   Multiple  0   Live Births  2          Past Medical History:  Diagnosis Date   DVT (deep venous thrombosis) (HCC)    Pregnancy induced hypertension    Rectus sheath hematoma 06/12/2022   Sepsis without acute organ dysfunction (HCC)    Sickle cell anemia (HCC)    Uterine rupture during labor    Vitamin D deficiency 07/16/2021   Past Surgical History:  Procedure Laterality Date   CESAREAN SECTION     CESAREAN SECTION N/A 06/07/2022   Procedure: CESAREAN SECTION;  Surgeon: Tereso Newcomer, MD;  Location: [facility] LD ORS;  Service: Obstetrics;  Laterality: N/A;   Family History: family history includes Healthy in her father; Hypertension in her mother. Social History:  reports that she has never smoked. She has never used smokeless tobacco. She reports that she does not currently use alcohol. She reports that she does not use drugs.     Maternal Diabetes: No Genetic Screening: Normal Maternal Ultrasounds/Referrals: Normal Fetal Ultrasounds or other Referrals:  Referred to Materal Fetal Medicine  Maternal Substance Abuse:  No Significant Maternal Medications:  Meds include: Other: Lovenox Significant Maternal Lab Results:  Group B Strep negative Number of Prenatal Visits:greater than 3 verified prenatal visits Other Comments:  None  Review of Systems  Constitutional: Negative.  Negative for chills and fever.  Respiratory: Negative.    Cardiovascular: Negative.   Gastrointestinal: Negative.   Genitourinary: Negative.   Musculoskeletal: Negative.   Skin:  Negative for rash.  Neurological: Negative.  Negative for headaches.  Psychiatric/Behavioral: Negative.     History   Blood pressure 131/75, pulse  92, temperature 98 F (36.7 C), temperature source Oral, resp. rate 18, height 5\' 4"  (1.626 m), weight 90.5 kg, last menstrual period 08/26/2022, SpO2 95%, not currently breastfeeding. Exam Physical Exam Vitals reviewed.  Constitutional:      General: She is not in acute distress.    Appearance: She is well-developed.  HENT:     Head: Normocephalic and atraumatic.  Eyes:     Conjunctiva/sclera: Conjunctivae normal.  Neck:     Thyroid: No thyromegaly.  Cardiovascular:     Rate and Rhythm: Normal rate and regular rhythm.  Pulmonary:     Effort: Pulmonary effort is normal.     Breath sounds: Normal breath sounds.  Abdominal:     Palpations: Abdomen is soft.     Tenderness: There is no abdominal tenderness. There is no guarding or rebound.  Musculoskeletal:        General: No tenderness. Normal range of motion.     Cervical back: Neck supple.  Skin:    General: Skin is warm and dry.  Neurological:     Mental Status: She is alert and oriented to person, place, and time.  Psychiatric:        Mood and Affect: Mood normal.        Behavior: Behavior normal.     Prenatal labs: ABO, Rh: --/--/A POS (07/17 0931) Antibody: NEG (  07/17 0931) Rubella: 4.64 (02/16 1009) RPR: NON REACTIVE (07/17 0920)  HBsAg: Negative (02/16 1009)  HIV: Non Reactive (05/01 0854)  GBS: Negative/-- (07/10 0810)   Assessment/Plan: 32 yo G3P2002 at 37 weeks 3 days for primary c/s and IUD insertion for history of uterine rupture during TOLAC   Hx of DVT--Lovenox last taken >48 hours ago; will restart postparutm Ancef and TXA prior to skin incision IUD post placental Thrombocytopenia--will monitor and have 2 units crossed Sickle cell disease--stable with no crisis recently Hx of gestational HTN with prior pregnancy--normal BPs this pregnancy; will monitor closely.   Elsie Lincoln 06/05/2023, 1:19 PM

## 2023-06-05 NOTE — Anesthesia Postprocedure Evaluation (Signed)
Anesthesia Post Note  Patient: Sonya Tapia  Procedure(s) Performed: CESAREAN SECTION INTRAUTERINE DEVICE (IUD) INSERTION (Cervix)     Patient location during evaluation: PACU Anesthesia Type: Spinal Level of consciousness: awake and alert, oriented and patient cooperative Pain management: pain level controlled Vital Signs Assessment: post-procedure vital signs reviewed and stable Respiratory status: spontaneous breathing, nonlabored ventilation and respiratory function stable Cardiovascular status: blood pressure returned to baseline and stable Postop Assessment: no backache, no headache, spinal receding and no apparent nausea or vomiting Anesthetic complications: no   No notable events documented.  Last Vitals:  Vitals:   06/05/23 1535 06/05/23 1600  BP: 116/84 115/77  Pulse: 88 72  Resp: 14 17  Temp:    SpO2: 99% 100%    Last Pain:  Vitals:   06/05/23 1233  TempSrc: Oral   Pain Goal:    LLE Motor Response: Purposeful movement (06/05/23 1535) LLE Sensation: Tingling (06/05/23 1535) RLE Motor Response: Purposeful movement (06/05/23 1535) RLE Sensation: Tingling (06/05/23 1535) L Sensory Level: T10-Umbilical region (06/05/23 1535) R Sensory Level: T10-Umbilical region (06/05/23 1535) Epidural/Spinal Function Cutaneous sensation: Tingles (06/05/23 1535), Patient able to flex knees: No (06/05/23 1535), Patient able to lift hips off bed: No (06/05/23 1535), Back pain beyond tenderness at insertion site: No (06/05/23 1535), Progressively worsening motor and/or sensory loss: No (06/05/23 1535), Bowel and/or bladder incontinence post epidural: No (06/05/23 1535)  T[place]

## 2023-06-05 NOTE — Anesthesia Procedure Notes (Signed)
Spinal  Patient location during procedure: OR Start time: 06/05/2023 2:09 PM End time: 06/05/2023 2:11 PM Reason for block: surgical anesthesia Staffing Performed: anesthesiologist  Anesthesiologist: Lannie Fields, DO Performed by: Lannie Fields, DO Authorized by: Lannie Fields, DO   Preanesthetic Checklist Completed: patient identified, IV checked, risks and benefits discussed, surgical consent, monitors and equipment checked, pre-op evaluation and timeout performed Spinal Block Patient position: sitting Prep: DuraPrep and site prepped and draped Patient monitoring: cardiac monitor, continuous pulse ox and blood pressure Approach: midline Location: L3-4 Injection technique: single-shot Needle Needle type: Pencan  Needle gauge: 24 G Needle length: 9 cm Assessment Sensory level: T6 Events: CSF return Additional Notes Functioning IV was confirmed and monitors were applied. Sterile prep and drape, including hand hygiene and sterile gloves were used. The patient was positioned and the spine was prepped. The skin was anesthetized with lidocaine.  Free flow of clear CSF was obtained prior to injecting local anesthetic into the CSF.  The spinal needle aspirated freely following injection.  The needle was carefully withdrawn.  The patient tolerated the procedure well.

## 2023-06-06 LAB — BPAM PLATELET PHERESIS
Blood Product Expiration Date: 202407202359
ISSUE DATE / TIME: 202407191408

## 2023-06-06 LAB — CBC
HCT: 29 % — ABNORMAL LOW (ref 36.0–46.0)
Hemoglobin: 10.3 g/dL — ABNORMAL LOW (ref 12.0–15.0)
MCH: 28.9 pg (ref 26.0–34.0)
MCHC: 35.5 g/dL (ref 30.0–36.0)
MCV: 81.2 fL (ref 80.0–100.0)
Platelets: 99 10*3/uL — ABNORMAL LOW (ref 150–400)
RBC: 3.57 MIL/uL — ABNORMAL LOW (ref 3.87–5.11)
RDW: 14.1 % (ref 11.5–15.5)
WBC: 9.5 10*3/uL (ref 4.0–10.5)
nRBC: 0.4 % — ABNORMAL HIGH (ref 0.0–0.2)

## 2023-06-06 LAB — PREPARE PLATELET PHERESIS: Unit division: 0

## 2023-06-06 LAB — RPR: RPR Ser Ql: NONREACTIVE

## 2023-06-06 MED ORDER — ACETAMINOPHEN 500 MG PO TABS
1000.0000 mg | ORAL_TABLET | Freq: Four times a day (QID) | ORAL | Status: DC
  Administered 2023-06-06 – 2023-06-07 (×4): 1000 mg via ORAL
  Filled 2023-06-06 (×4): qty 2

## 2023-06-06 NOTE — Progress Notes (Cosign Needed)
POSTPARTUM PROGRESS NOTE  POD #1 rLTCS @ 3:49 pm 7/19  Subjective:  Sonya Tapia is a 32 y.o. G3P3003 s/p rLTCS at [redacted]w[redacted]d. No acute events overnight. She reports she is doing well and has gotten up to walk around after catheter removed. She has not yet urinated as she said she hasn't felt the need. She denies any problems with ambulating, voiding or po intake. Denies nausea or vomiting. She has not passed flatus. Pain is well controlled. She has received her lovenox injection.  Objective: Blood pressure 117/75, pulse (!) 57, temperature 98 F (36.7 C), temperature source Oral, resp. rate 18, height 5\' 4"  (1.626 m), weight 90.5 kg, last menstrual period 08/26/2022, SpO2 99%, unknown if currently breastfeeding.  Physical Exam:  General: alert, cooperative and no distress Chest: no respiratory distress Heart: regular rate, distal pulses intact Uterine Fundus: firm, appropriately tender DVT Evaluation: No calf swelling or tenderness Extremities: No edema Skin: Honeycomb dressing in place  Recent Labs    06/05/23 1250 06/06/23 0419  HGB 9.7* 10.3*  HCT 27.8* 29.0*    Assessment/Plan: Sonya Tapia is a 32 y.o. G3P3003 s/p rLTCS at [redacted]w[redacted]d for prior uterine rupture during TOLAC (2023). She has a history of DVT in 2020. She has received lovenox this morning. Blood pressures have been controlled this morning.  POD#1 - Doing well; pain is controlled. H/H appropriate  Routine postpartum care  OOB, ambulated  Lovenox for VTE prophylaxis Platelets are 99, up from 80, after 1u transfused. Contraception: Mirena IUD placed Feeding: Plans to breast feed colostrum at this time and bottle feed after discharge. Has not seen lactation specialist.  Dispo: Plan for discharge tomorrow.   LOS: 1 day   Kayleen Memos, Medical Student OB Fellow  06/06/2023, 7:27 AM  Resident Attestation  I saw and evaluated the patient, performing the key elements of the service.I  personally performed or  re-performed the history, physical exam, and medical decision making activities of this service and have verified that the service and findings are accurately documented in the student's note. I developed the management plan that is described in the medical student's note, and I agree with the content, with my edits above.    Derrel Nip, MD

## 2023-06-06 NOTE — Progress Notes (Signed)
POSTPARTUM PROGRESS NOTE  POD #1 rLTCS @ 3:49 pm 7/19  Subjective:  Sonya Tapia is a 32 y.o. G3P3003 s/p rLTCS at [redacted]w[redacted]d. No acute events overnight. She reports she is doing well and has gotten up to walk around after catheter removed. She has not yet urinated as she said she hasn't felt the need. She denies any problems with ambulating, voiding or po intake. Denies nausea or vomiting. She has not passed flatus. Pain is well controlled. She has received her lovenox injection.  Objective: Blood pressure 117/75, pulse (!) 57, temperature 98 F (36.7 C), temperature source Oral, resp. rate 18, height 5\' 4"  (1.626 m), weight 90.5 kg, last menstrual period 08/26/2022, SpO2 99%, unknown if currently breastfeeding.  Physical Exam:  General: alert, cooperative and no distress Chest: no respiratory distress Heart: regular rate, distal pulses intact Uterine Fundus: firm, appropriately tender DVT Evaluation: No calf swelling or tenderness Extremities: No edema Skin: Honeycomb dressing in place  Recent Labs    06/05/23 1250 06/06/23 0419  HGB 9.7* 10.3*  HCT 27.8* 29.0*    Assessment/Plan: Sonya Tapia is a 32 y.o. G3P3003 s/p rLTCS at [redacted]w[redacted]d for prior uterine rupture during TOLAC (2023). She has a history of DVT in 2020. She has received lovenox this morning. Blood pressures have been controlled this morning.  POD#1 - Doing well; pain is controlled. H/H appropriate  Routine postpartum care  OOB, ambulated  Lovenox for VTE prophylaxis Platelets are 99, up from 80, after 1u transfused. Contraception: Mirena IUD placed Feeding: Plans to breast feed colostrum at this time and bottle feed after discharge. Has not seen lactation specialist.  Dispo: Plan for discharge tomorrow.   LOS: 1 day   Deatra Ina, Medical Student  06/06/2023, 5:41 PM  Fellow Attestation  I saw and evaluated the patient, performing the key elements of the service.I  personally performed or re-performed the  history, physical exam, and medical decision making activities of this service and have verified that the service and findings are accurately documented in the student's note. I developed the management plan that is described in the medical student's note, and I agree with the content, with my edits above.    Derrel Nip, MD

## 2023-06-07 LAB — BPAM RBC
Blood Product Expiration Date: 202408222359
ISSUE DATE / TIME: 202407191830
Unit Type and Rh: 5100
Unit Type and Rh: 6200

## 2023-06-07 LAB — TYPE AND SCREEN
ABO/RH(D): A POS
Antibody Screen: NEGATIVE
Unit division: 0
Unit division: 0
Unit division: 0
Unit division: 0

## 2023-06-07 MED ORDER — OXYCODONE HCL 5 MG PO TABS: 5.0000 mg | ORAL_TABLET | Freq: Four times a day (QID) | ORAL | 0 refills | Status: AC | PRN

## 2023-06-07 MED ORDER — ACETAMINOPHEN 500 MG PO TABS: 1000.0000 mg | ORAL_TABLET | Freq: Four times a day (QID) | ORAL | 0 refills | Status: AC

## 2023-06-07 MED ORDER — SENNOSIDES-DOCUSATE SODIUM 8.6-50 MG PO TABS: 2.0000 | ORAL_TABLET | Freq: Every day | ORAL | 1 refills | Status: AC

## 2023-06-07 NOTE — Progress Notes (Addendum)
Patient ID: Sonya Tapia, female   DOB: 1991/02/03, 32 y.o.   MRN: [id number]  Layliana Devins [phone] Postpartum Day 2 S/P Repeat Elective Cesarean Section  Subjective: Patient up ad lib, denies syncope or dizziness. Reports consuming regular diet without issues and denies N/V. Patient reports no bowel movement or passing flatus.  Denies issues with urination and reports bleeding is "light."  Patient is bottlefeeding and pumping and reports going well.  PPIUD placed for postpartum contraception.  Pain is being appropriately managed with use of tylenol and gabapentin.   Objective: Temp:  [98.1 F (36.7 C)-99.1 F (37.3 C)] 98.1 F (36.7 C) (07/21 0534) Pulse Rate:  [77-88] 77 (07/21 0534) Resp:  [17-18] 17 (07/21 0534) BP: (103-120)/(56-69) 120/69 (07/21 0534) SpO2:  [98 %-99 %] 99 % (07/21 0534)  Recent Labs    06/05/23 1250 06/06/23 0419  HGB 9.7* 10.3*  HCT 27.8* 29.0*  WBC 6.6 9.5    Physical Exam:  General: alert, cooperative, and no distress Mood/Affect: Appropriate/Appropriate Lungs: clear to auscultation, no wheezes, rales or rhonchi, symmetric air entry.  Heart: normal rate and regular rhythm. Breast: breasts appear normal, no suspicious masses, no skin or nipple changes or axillary nodes, not examined. Abdomen:  + bowel sounds, Soft, Appropriately Tender Incision: No significant drainage noted on Honeycomb dressing  Uterine Fundus: firm at the umbilicus Lochia: appropriate Skin: Warm, Dry. DVT Evaluation: No significant calf/ankle edema. JP drain:   None  Assessment Post Operative Day One S/P Repeat C/S Normal Involution Bottle Feeding Hemodynamically Stable  Plan: -Reviewed plt ct and HgB. -Instructed to continue iron supplement. -Instructed to continue Lovenox dosing x 6 weeks. -Patient requests oxycodone for pain management. Script to be sent.  -Discussed management of IUD strings and when to contact office. -Informed that senokot to be sent for  bowel regimen. Take for next 7 days and contact office or come to [facility].  -Continue other mgmt as ordered -Discharge orders to be placed.   Cherre Robins MSN, CNM 06/07/2023, 7:25 AM  Addendum 8:45 AM After discussion with team. Dr. Karolee Ohs advises continued monitoring until passing of gas occurs.   Cherre Robins MSN, CNM Advanced Practice Provider, Center for [employer]

## 2023-06-23 ENCOUNTER — Ambulatory Visit: Payer: Medicaid Other | Admitting: Obstetrics & Gynecology

## 2023-07-02 ENCOUNTER — Telehealth (HOSPITAL_COMMUNITY): Payer: Self-pay | Admitting: *Deleted

## 2023-07-02 NOTE — Telephone Encounter (Signed)
07/02/2023  Name: Sonya Tapia MRN: [id number] DOB: [birth year]  Reason for Call:  Transition of Care Hospital Discharge Call  Contact Status: Patient Contact Status: Complete  Language assistant needed: Interpreter Mode: Interpreter Not Needed        Follow-Up Questions: Do You Have Any Concerns About Your Health As You Heal From Delivery?: Yes What Concerns Do You Have About Your Health?: Having a little trouble with my IUD.  Has an appt coming up soon and plans to ask at that appt. Do You Have Any Concerns About Your Infants Health?: No  Edinburgh Postnatal Depression Scale:  In the Past 7 Days: I have been able to laugh and see the funny side of things.: As much as I always could I have looked forward with enjoyment to things.: As much as I ever did I have blamed myself unnecessarily when things went wrong.: No, never I have been anxious or worried for no good reason.: No, not at all I have felt scared or panicky for no good reason.: No, not at all Things have been getting on top of me.: No, I have been coping as well as ever I have been so unhappy that I have had difficulty sleeping.: Not at all I have felt sad or miserable.: No, not at all I have been so unhappy that I have been crying.: No, never The thought of harming myself has occurred to me.: Never [name] Postnatal Depression Scale Total: 0  PHQ2-9 Depression Scale:     Discharge Follow-up: Edinburgh score requires follow up?: No Patient was advised of the following resources:: Support Group, Breastfeeding Support Group (declines postpartum group information via email)  Post-discharge interventions: Reviewed Newborn Safe Sleep Practices  Salena Saner, RN 07/02/2023 13:40

## 2023-07-07 ENCOUNTER — Ambulatory Visit (INDEPENDENT_AMBULATORY_CARE_PROVIDER_SITE_OTHER): Payer: Medicaid Other | Admitting: Family Medicine

## 2023-07-07 ENCOUNTER — Other Ambulatory Visit: Payer: Self-pay

## 2023-07-07 ENCOUNTER — Encounter: Payer: Self-pay | Admitting: Family Medicine

## 2023-07-07 VITALS — BP 125/87 | HR 78 | Ht 64.0 in | Wt 183.0 lb

## 2023-07-07 DIAGNOSIS — Z30431 Encounter for routine checking of intrauterine contraceptive device: Secondary | ICD-10-CM

## 2023-07-07 NOTE — Progress Notes (Signed)
    Post Partum Visit Note  Sonya Tapia is a 32 y.o. G55P3003 female who presents for a postpartum visit. She is 4 weeks postpartum following a repeat cesarean section.  I have fully reviewed the prenatal and intrapartum course. The delivery was at [redacted] weeks gestational weeks.  Anesthesia: spinal. Postpartum course has been normal. Baby is doing well. Baby is feeding by bottle - Similac Advance. Bleeding staining only. Bowel function is normal. Bladder function is normal. Patient is not sexually active. Contraception method is IUD. Postpartum depression screening: negative.   The pregnancy intention screening data noted above was reviewed. Potential methods of contraception were discussed. The patient elected to proceed with No data recorded.    Health Maintenance Due  Topic Date Due   COVID-19 Vaccine (1 - 2023-24 season) Never done   INFLUENZA VACCINE  06/18/2023    The following portions of the patient's history were reviewed and updated as appropriate: allergies, current medications, past family history, past medical history, past social history, past surgical history, and problem list.  Review of Systems Pertinent items noted in HPI and remainder of comprehensive ROS otherwise negative.  Objective:  BP 125/87   Pulse 78   Ht 5\' 4"  (1.626 m)   Wt 183 lb (83 kg)   LMP 08/26/2022 (Approximate)   Breastfeeding No   BMI 31.41 kg/m    General:  alert, cooperative, and appears stated age   Breasts:  not indicated  Lungs: Normal effort  Heart:  regular rate and rhythm  Abdomen: soft, non-tender; bowel sounds normal; no masses,  no organomegaly   Wound well approximated incision  GU exam:  normal IUD strings are outside of her and trimmed to 2 cm. Cervix appears Nml. No parts of IUD noted at cx.       Assessment:    Normal postpartum exam.   Plan:   Essential components of care per ACOG recommendations:  1.  Mood and well being: Patient with negative depression  screening today. Reviewed local resources for support.  - Patient tobacco use? No.   - hx of drug use? No.    2. Infant care and feeding:  -Patient currently breastmilk feeding? No.  -Social determinants of health (SDOH) reviewed in EPIC. No concerns  3. Sexuality, contraception and birth spacing - Patient does not want a pregnancy in the next year.  Desired family size is 3 children.  - Reviewed reproductive life planning. Reviewed contraceptive methods based on pt preferences and effectiveness.  Patient desired IUD or IUS today.   - Discussed birth spacing of 18 months  4. Sleep and fatigue -Encouraged family/partner/community support of 4 hrs of uninterrupted sleep to help with mood and fatigue  5. Physical Recovery  - Discussed patients delivery and complications. She describes her labor as good. - Patient had a C-section repeat; no problems after deliver. - Patient has urinary incontinence? No. - Patient is safe to resume physical and sexual activity  6.  Health Maintenance - HM due items addressed Yes - Last pap smear  Diagnosis  Date Value Ref Range Status  06/04/2022   Final   - Negative for intraepithelial lesion or malignancy (NILM)   Pap smear not done at today's visit.  -Breast Cancer screening indicated? No.   7. Chronic Disease/Pregnancy Condition follow up: None  - PCP follow up  Reva Bores, MD Center for [facility], [facility]

## 2023-07-14 ENCOUNTER — Ambulatory Visit: Payer: Medicaid Other | Admitting: Obstetrics & Gynecology

## 2023-08-04 ENCOUNTER — Emergency Department (HOSPITAL_COMMUNITY): Payer: Medicaid Other

## 2023-08-04 ENCOUNTER — Encounter (HOSPITAL_COMMUNITY): Payer: Self-pay | Admitting: Emergency Medicine

## 2023-08-04 ENCOUNTER — Emergency Department (HOSPITAL_COMMUNITY)
Admission: EM | Admit: 2023-08-04 | Discharge: 2023-08-05 | Disposition: A | Discharge status: Home / Self Care | Payer: Medicaid Other | Attending: Emergency Medicine | Admitting: Emergency Medicine

## 2023-08-04 DIAGNOSIS — R109 Unspecified abdominal pain: Secondary | ICD-10-CM | POA: Diagnosis present

## 2023-08-04 DIAGNOSIS — Z20822 Contact with and (suspected) exposure to covid-19: Secondary | ICD-10-CM | POA: Diagnosis not present

## 2023-08-04 DIAGNOSIS — D649 Anemia, unspecified: Secondary | ICD-10-CM | POA: Diagnosis not present

## 2023-08-04 DIAGNOSIS — R1012 Left upper quadrant pain: Secondary | ICD-10-CM

## 2023-08-04 LAB — CBC
HCT: 21.9 % — ABNORMAL LOW (ref 36.0–46.0)
Hemoglobin: 7.5 g/dL — ABNORMAL LOW (ref 12.0–15.0)
MCH: 24.4 pg — ABNORMAL LOW (ref 26.0–34.0)
MCHC: 34.2 g/dL (ref 30.0–36.0)
MCV: 71.3 fL — ABNORMAL LOW (ref 80.0–100.0)
Platelets: 83 10*3/uL — ABNORMAL LOW (ref 150–400)
RBC: 3.07 MIL/uL — ABNORMAL LOW (ref 3.87–5.11)
RDW: 15.2 % (ref 11.5–15.5)
WBC: 8.1 10*3/uL (ref 4.0–10.5)
nRBC: 0.5 % — ABNORMAL HIGH (ref 0.0–0.2)

## 2023-08-04 LAB — COMPREHENSIVE METABOLIC PANEL
ALT: 17 U/L (ref 0–44)
AST: 23 U/L (ref 15–41)
Albumin: 3.4 g/dL — ABNORMAL LOW (ref 3.5–5.0)
Alkaline Phosphatase: 62 U/L (ref 38–126)
Anion gap: 11 (ref 5–15)
BUN: 12 mg/dL (ref 6–20)
CO2: 23 mmol/L (ref 22–32)
Calcium: 8.9 mg/dL (ref 8.9–10.3)
Chloride: 103 mmol/L (ref 98–111)
Creatinine, Ser: 1.18 mg/dL — ABNORMAL HIGH (ref 0.44–1.00)
GFR, Estimated: >60 mL/min (ref >60)
Glucose, Bld: 102 mg/dL — ABNORMAL HIGH (ref 70–99)
Potassium: 3.8 mmol/L (ref 3.5–5.1)
Sodium: 137 mmol/L (ref 135–145)
Total Bilirubin: 1.6 mg/dL — ABNORMAL HIGH (ref 0.3–1.2)
Total Protein: 7.4 g/dL (ref 6.5–8.1)

## 2023-08-04 LAB — RESP PANEL BY RT-PCR (RSV, FLU A&B, COVID)  RVPGX2
Influenza A by PCR: NEGATIVE
Influenza B by PCR: NEGATIVE
Resp Syncytial Virus by PCR: NEGATIVE
SARS Coronavirus 2 by RT PCR: NEGATIVE

## 2023-08-04 NOTE — ED Triage Notes (Signed)
Pt here from home with with left upper abd pain hurts worse when she takes a deep breath , pt just got back fro [place] , no fevers

## 2023-08-05 ENCOUNTER — Emergency Department (HOSPITAL_COMMUNITY): Payer: Medicaid Other

## 2023-08-05 LAB — URINALYSIS, ROUTINE W REFLEX MICROSCOPIC
Bilirubin Urine: NEGATIVE
Glucose, UA: NEGATIVE mg/dL
Ketones, ur: NEGATIVE mg/dL
Nitrite: NEGATIVE
Protein, ur: NEGATIVE mg/dL
Specific Gravity, Urine: 1.012 (ref 1.005–1.030)
pH: 5 (ref 5.0–8.0)

## 2023-08-05 LAB — PREGNANCY, URINE: Preg Test, Ur: NEGATIVE

## 2023-08-05 MED ORDER — IOHEXOL 350 MG/ML SOLN
75.0000 mL | Freq: Once | INTRAVENOUS | Status: AC | PRN
  Administered 2023-08-05: 75 mL via INTRAVENOUS

## 2023-08-05 NOTE — Discharge Instructions (Signed)
As we discussed, your workup in the ER today was reassuring for acute findings.  Laboratory evaluation and CT imaging did not reveal any emergent concerns.  However, does appear that your hemoglobin has dropped a few points since it was checked last.  It is now 7.5.  Given that you do not seem to be significantly symptomatic from this and are not having any significant bleeding anymore, there is no indication for additional intervention in the hospital today.  This drop is likely due to postpartum bleeding that you have been experiencing.  Is very important that you follow-up with your primary doctor closely to ensure that this does not continue to drop.  Please call your primary doctor at your earliest convenience to schedule follow-up appointment.  Return if development of any new or worsening symptoms.

## 2023-08-05 NOTE — ED Provider Notes (Signed)
[facility] [facility] DEPARTMENT AT [facility] Provider Note   CSN: [id number] Arrival date & time: 08/04/23  1716     History  Chief Complaint  Patient presents with   Abdominal Pain    Sonya Tapia is a 32 y.o. female.  Patient with history of sickle cell disease, DVT not currently on anticoagulation presents today with complaints of abdominal pain. She states that same began earlier today and has been persistent since then. She notes that the pain is located on her left upper side area and does not radiate. She does note that her pain is worse with deep inspiration. She just took a plane ride back from [facility]. Denies leg pain or leg swelling. She does note that she had a DVT in 2020 and thinks she had a PE as well but is not sure. She notes that she was on Lovenox briefly for this but has since been discontinued. She states this clot was attributed to COVID that she had at the time of diagnosis. She denies any chest pain. Does note that she just gave birth in 7/24. Mentions that she did have bleeding for an extended time afterwards and still has occasional spotting. She denies any fatigue, lightheadedness, or shortness of breath. No leg pain or leg swelling. Denies nausea, vomiting, or diarrhea. No urinary symptoms. She has an IUD in place, no OCPs  The history is provided by the patient. No language interpreter was used.  Abdominal Pain      Home Medications Prior to Admission medications   Medication Sig Start Date End Date Taking? Authorizing Provider  acetaminophen (TYLENOL) 500 MG tablet Take 2 tablets (1,000 mg total) by mouth every 6 (six) hours. 06/07/23   Ndulue, Chiagoziem J, MD  enoxaparin (LOVENOX) 40 MG/0.4ML injection Inject 0.4 mLs (40 mg total) into the skin daily for 360 doses. 12/18/22 12/13/23  Reva Bores, MD  ferrous sulfate (FERROUSUL) 325 (65 FE) MG tablet Take 1 tablet (325 mg total) by mouth every other day. 02/07/22   Constant, Peggy,  MD  oxyCODONE (OXY IR/ROXICODONE) 5 MG immediate release tablet Take 1-2 tablets (5-10 mg total) by mouth every 6 (six) hours as needed for moderate pain. 06/07/23   Ndulue, Chiagoziem J, MD  Prenatal Vit-Fe Fumarate-FA (MULTIVITAMIN-PRENATAL) 27-0.8 MG TABS tablet Take 1 tablet by mouth daily at 12 noon.    [provider]  senna-docusate (SENOKOT-S) 8.6-50 MG tablet Take 2 tablets by mouth daily. 06/08/23   Ndulue, Nadene Rubins, MD      Allergies    Patient has no known allergies.    Review of Systems   Review of Systems  Gastrointestinal:  Positive for abdominal pain.  All other systems reviewed and are negative.   Physical Exam Updated Vital Signs BP 113/75 (BP Location: Left Arm)   Pulse 91   Temp 99.2 F (37.3 C)   Resp 15   SpO2 100%  Physical Exam Vitals and nursing note reviewed.  Constitutional:      General: She is not in acute distress.    Appearance: Normal appearance. She is normal weight. She is not ill-appearing, toxic-appearing or diaphoretic.     Comments: Resting comfortably in bed  HENT:     Head: Normocephalic and atraumatic.  Cardiovascular:     Rate and Rhythm: Normal rate and regular rhythm.     Heart sounds: Normal heart sounds.  Pulmonary:     Effort: Pulmonary effort is normal. No respiratory distress.  Breath sounds: Normal breath sounds.  Abdominal:     General: Abdomen is flat.     Palpations: Abdomen is soft.     Tenderness: There is no abdominal tenderness.    Musculoskeletal:        General: Normal range of motion.     Cervical back: Normal range of motion.  Skin:    General: Skin is warm and dry.  Neurological:     General: No focal deficit present.     Mental Status: She is alert.  Psychiatric:        Mood and Affect: Mood normal.        Behavior: Behavior normal.     ED Results / Procedures / Treatments   Labs (all labs ordered are listed, but only abnormal results are displayed) Labs Reviewed  CBC - Abnormal;  Notable for the following components:      Result Value   RBC 3.07 (*)    Hemoglobin 7.5 (*)    HCT 21.9 (*)    MCV 71.3 (*)    MCH 24.4 (*)    Platelets 83 (*)    nRBC 0.5 (*)    All other components within normal limits  COMPREHENSIVE METABOLIC PANEL - Abnormal; Notable for the following components:   Glucose, Bld 102 (*)    Creatinine, Ser 1.18 (*)    Albumin 3.4 (*)    Total Bilirubin 1.6 (*)    All other components within normal limits  URINALYSIS, ROUTINE W REFLEX MICROSCOPIC - Abnormal; Notable for the following components:   Hgb urine dipstick SMALL (*)    Leukocytes,Ua TRACE (*)    Bacteria, UA RARE (*)    All other components within normal limits  RESP PANEL BY RT-PCR (RSV, FLU A&B, COVID)  RVPGX2  PREGNANCY, URINE    EKG None  Radiology CT Angio Chest PE W and/or Wo Contrast  Result Date: 08/05/2023 CLINICAL DATA:  Left-sided chest pain EXAM: CT ANGIOGRAPHY CHEST WITH CONTRAST TECHNIQUE: Multidetector CT imaging of the chest was performed using the standard protocol during bolus administration of intravenous contrast. Multiplanar CT image reconstructions and MIPs were obtained to evaluate the vascular anatomy. RADIATION DOSE REDUCTION: This exam was performed according to the departmental dose-optimization program which includes automated exposure control, adjustment of the mA and/or kV according to patient size and/or use of iterative reconstruction technique. CONTRAST:  75mL OMNIPAQUE IOHEXOL 350 MG/ML SOLN COMPARISON:  Plain film from the previous day. FINDINGS: Cardiovascular: Thoracic aorta shows no evidence of aneurysmal dilatation or dissection. Heart is at the upper limits of normal in size. The pulmonary artery shows a normal branching pattern bilaterally. No filling defect to suggest pulmonary embolism is noted. Mediastinum/Nodes: Thoracic inlet is within normal limits. No hilar or mediastinal adenopathy is noted. The esophagus as visualized is within normal  limits. Lungs/Pleura: Mild bibasilar atelectatic changes are seen. No sizable effusion or pneumothorax is noted. No parenchymal nodules are seen. Upper Abdomen: No acute abnormality. Musculoskeletal: No acute bony abnormality noted. Review of the MIP images confirms the above findings. IMPRESSION: No evidence of pulmonary emboli. Mild bibasilar atelectatic changes. Electronically Signed   By: Alcide Clever M.D.   On: 08/05/2023 02:42   DG Chest 2 View  Result Date: 08/04/2023 CLINICAL DATA:  Shortness of breath, left rib pain EXAM: CHEST - 2 VIEW COMPARISON:  06/12/2022 FINDINGS: Lungs are clear.  No pleural effusion or pneumothorax. The heart is normal in size. Visualized osseous structures are within normal limits. IMPRESSION: Normal  chest radiographs. Electronically Signed   By: Charline Bills M.D.   On: 08/04/2023 18:50    Procedures Procedures    Medications Ordered in ED Medications  iohexol (OMNIPAQUE) 350 MG/ML injection 75 mL (75 mLs Intravenous Contrast Given 08/05/23 0230)    ED Course/ Medical Decision Making/ A&P                                 Medical Decision Making Amount and/or Complexity of Data Reviewed Labs: ordered. Radiology: ordered.  Risk Prescription drug management.   This patient is a 33 y.o. female who presents to the ED for concern of left upper abdominal pain, this involves an extensive number of treatment options, and is a complaint that carries with it a high risk of complications and morbidity. The emergent differential diagnosis prior to evaluation includes, but is not limited to,  The differential diagnosis for generalized abdominal pain includes, but is not limited to PE, PUD, GERD, gastritis, malignancy, biliary disease, pericarditis, pneumonia, AAA, Gastroparesis, DKA, Hernia, Inflammatory bowel disease, mesenteric ischemia, pancreatitis, peritonitis SBP, volvulus, pregnancy  This is not an exhaustive differential.   Past Medical History /  Co-morbidities / Social History:  has a past medical history of DVT (deep venous thrombosis) (HCC), Pregnancy induced hypertension, Rectus sheath hematoma (06/12/2022), Sepsis without acute organ dysfunction (HCC), Sickle cell anemia (HCC), Uterine rupture during labor, and Vitamin D deficiency (07/16/2021).   Additional history: Chart reviewed. Pertinent results include: unable to review patients records from when she had a DVT in 2020  Physical Exam: Physical exam performed. The pertinent findings include: abdomen soft and non-tender. Patient well appearing in no acute distress  Lab Tests: I ordered, and personally interpreted labs.  The pertinent results include:  hgb 7.5 down from 10.3 2 months ago. Likely due to patients postpartum bleeding after giving birth in July. Patients baseline appears to be around 10. Creatinine 1.18, appears to be baseline. T bili 1.6, consistent with baseline. COVID negative, UA noninfectious   Imaging Studies: I ordered imaging studies including CXR, CTA PE. I independently visualized and interpreted imaging which showed   CXR: NAD  CTA: negative  I agree with the radiologist interpretation.  Disposition: After consideration of the diagnostic results and the patients response to treatment, I feel that emergency department workup does not suggest an emergent condition requiring admission or immediate intervention beyond what has been performed at this time. The plan is: discharge with close outpatient follow-up and return precautions. Patients work-up is benign, she is well appearing with stable vital signs. She does have a significant drop in her hemoglobin, however she is not symptomatic from this. Likely etiology is from her postpartum bleeding which she is no longer having. No hematochezia or melena. I have informed her of this drop, she has good follow-up with her pcp to discuss management of this outpatient. Unclear etiology of pain, however she does note  that this has improved as well since she arrived here. Could potentially be MSK due to her long travel yesterday. Evaluation and diagnostic testing in the emergency department does not suggest an emergent condition requiring admission or immediate intervention beyond what has been performed at this time.  Plan for discharge with close PCP follow-up.  Patient is understanding and amenable with plan, educated on red flag symptoms that would prompt immediate return.  Patient discharged in stable condition.  Final Clinical Impression(s) / ED Diagnoses Final diagnoses:  Left upper quadrant abdominal pain  Low hemoglobin    Rx / DC Orders ED Discharge Orders     None     An After Visit Summary was printed and given to the patient.     Vear Clock 08/05/23 0403    Zadie Rhine, MD 08/05/23 [phone]

## 2023-08-05 NOTE — ED Notes (Signed)
Complaining of pain in the abdomen, hurts when she moves

## 2024-01-14 IMAGING — US US MFM OB FOLLOW-UP
1 series · 14 of 28 positions shown · non-contrast
Comparison: none

[Series 1: us mfm ob follow-up · 46 acquisitions, 14 frames shown]
[im 2/46]
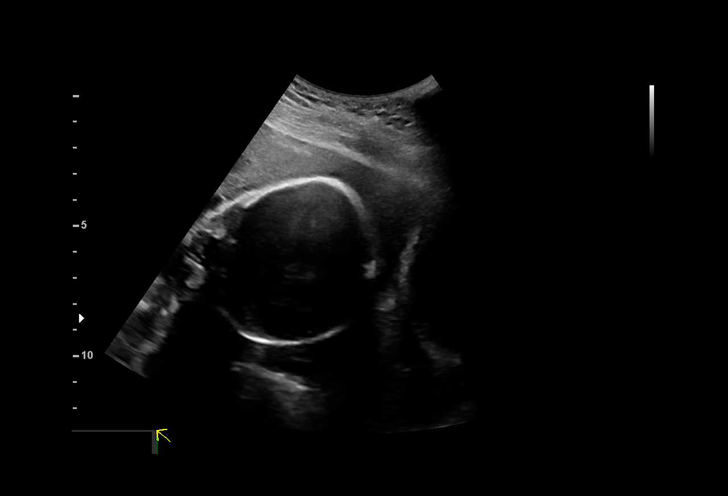
[im 6/46]
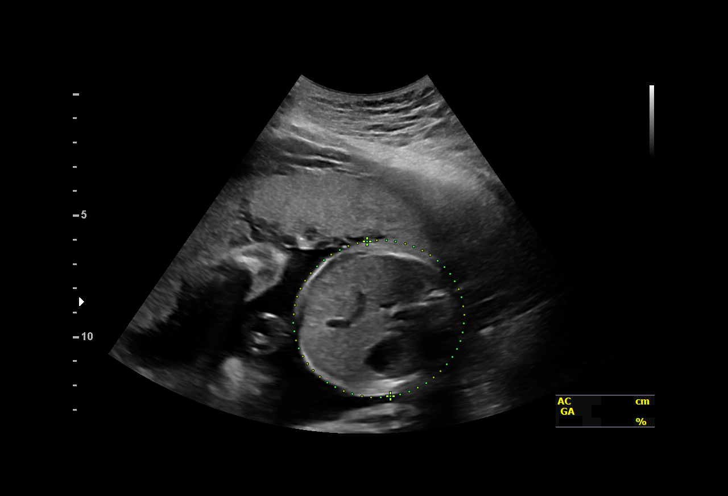
[im 9/46]
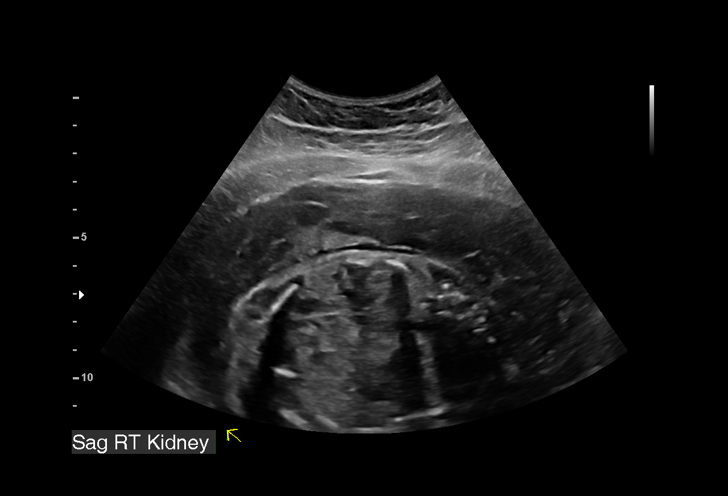
[im 12/46]
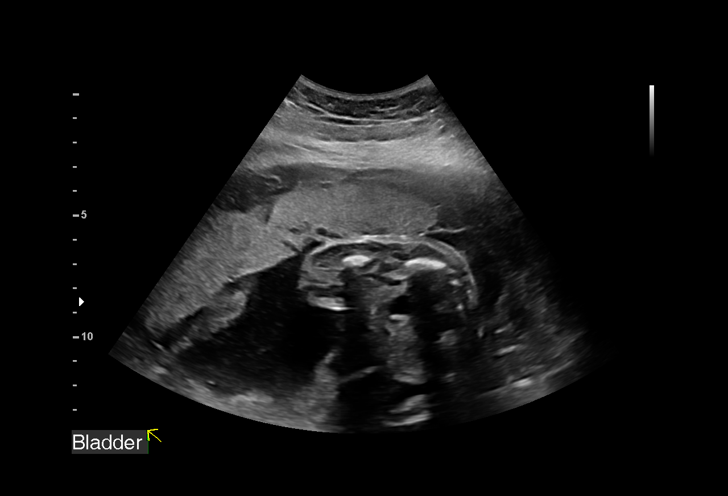
[im 16/46]
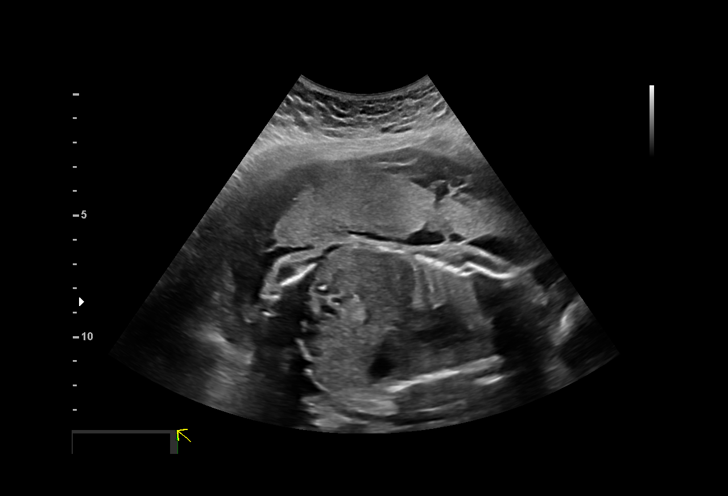
[im 19/46]
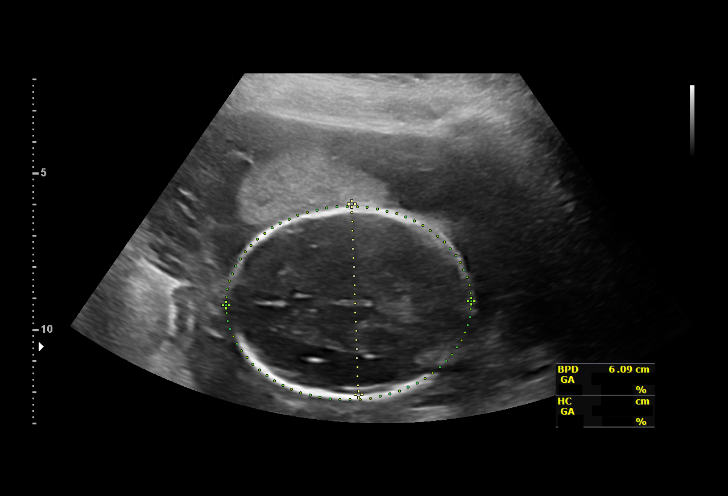
[im 22/46]
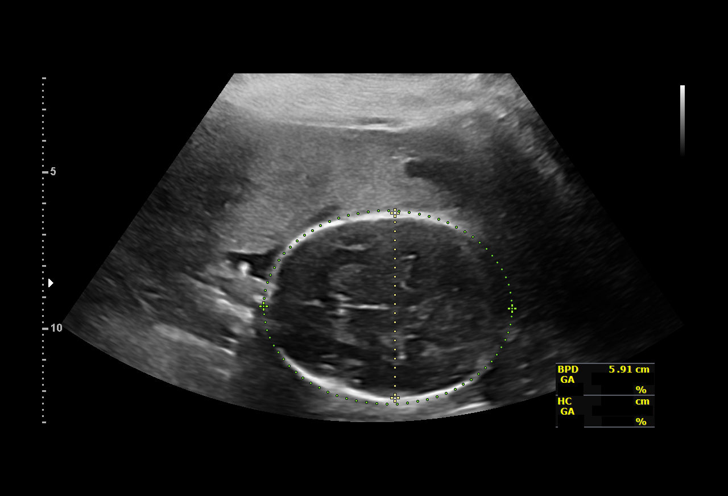
[im 26/46]
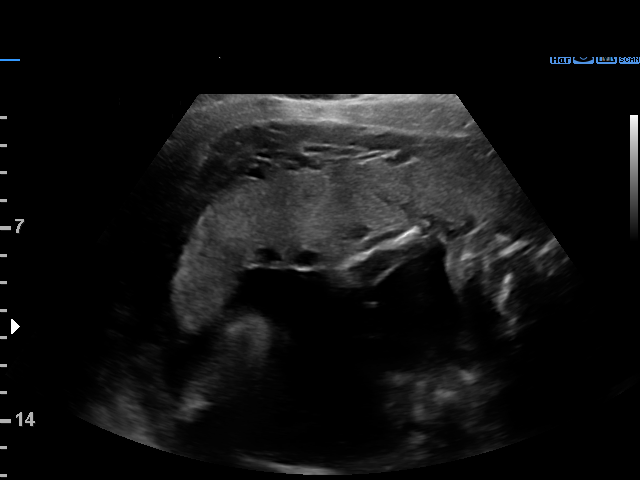
[im 29/46]
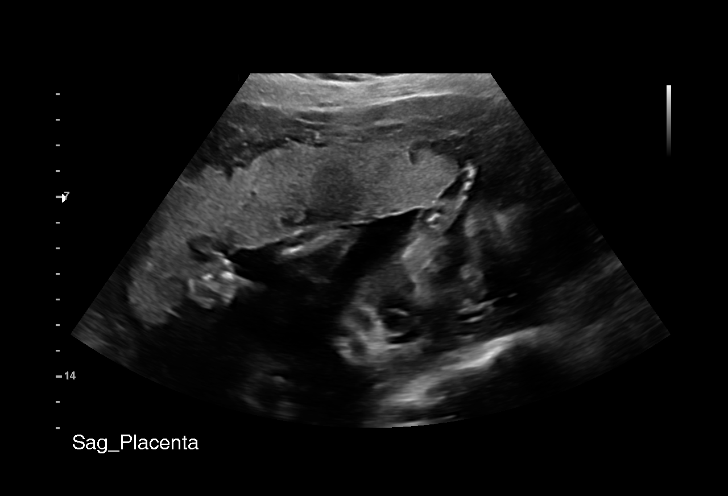
[im 32/46]
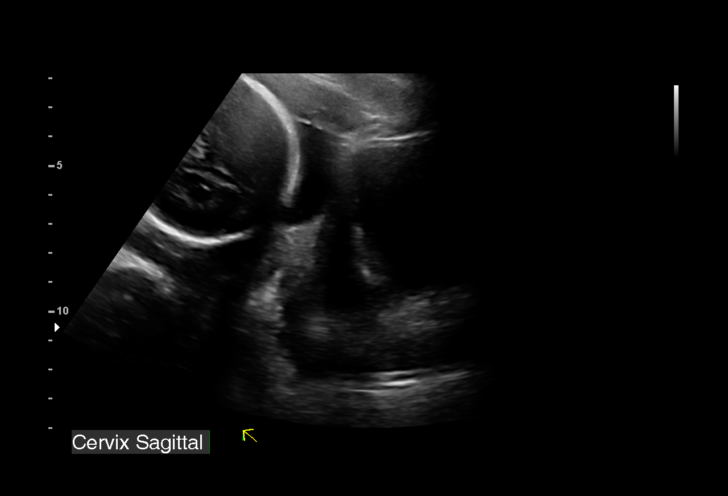
[im 36/46]
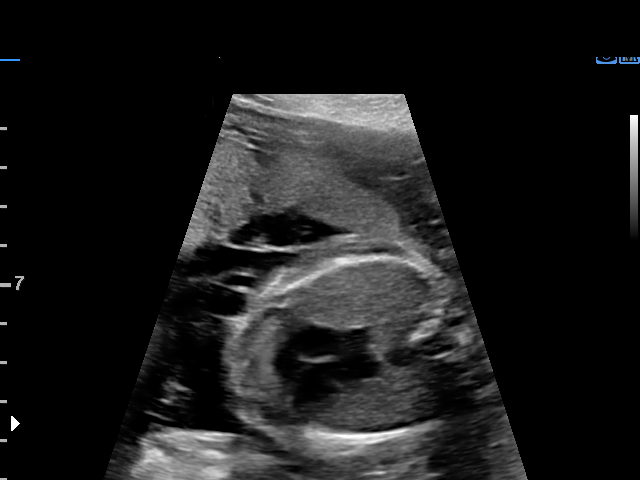
[im 39/46]
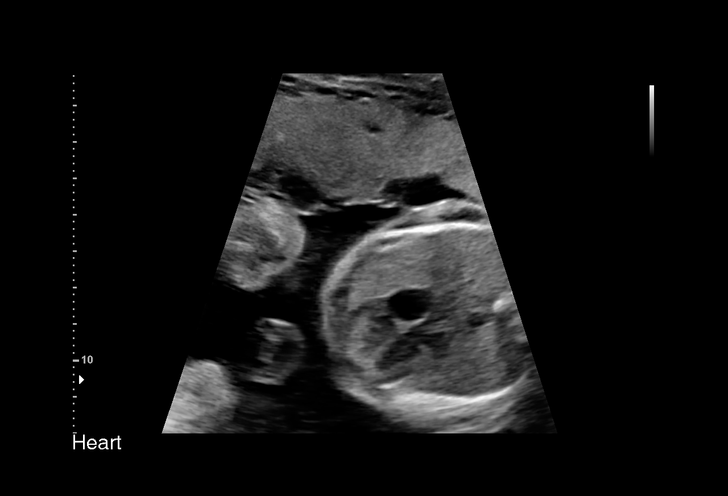
[im 42/46]
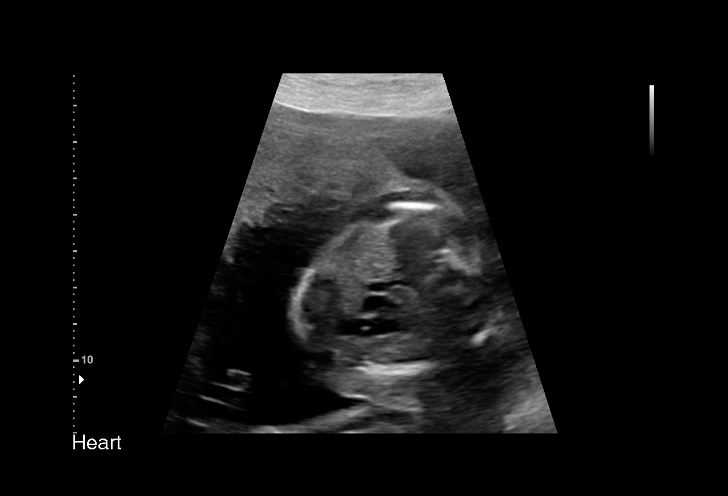
[im 46/46]
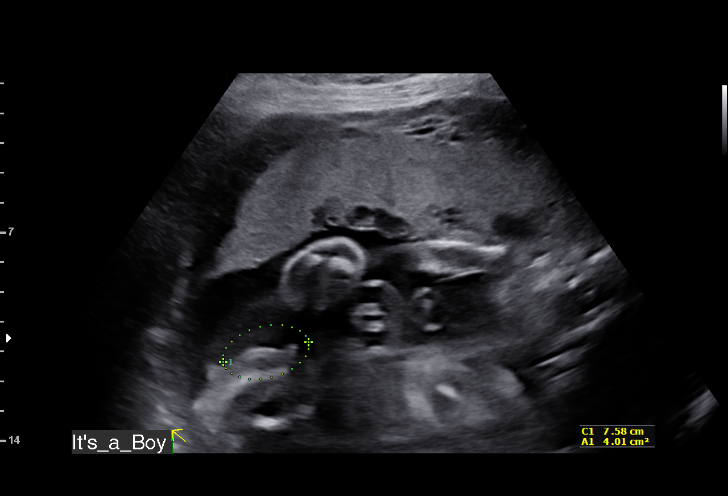

[14 of 28 positions shown; findings below may reference images not displayed]

Indications

 Maternal sickle cell anemia, second trimester  O99.012,
 Previous cesarean delivery, antepartum
 Encounter for other antenatal screening
 follow-up
 24 weeks gestation of pregnancy
 Obesity complicating pregnancy, second
 trimester (BMI 30)
 Low Risk NIPS
 Genetic carrier ([employer]ent [employer])(OCOY
 Hgb)
Vital Signs

 BMI:
Fetal Evaluation

 Num Of Fetuses:         1
 Fetal Heart Rate(bpm):  147
 Cardiac Activity:       Observed
 Presentation:           Cephalic
 Placenta:               Anterior
 P. Cord Insertion:      Previously Visualized

 Amniotic Fluid
 AFI FV:      Within normal limits

                             Largest Pocket(cm)

Biometry

 BPD:      59.8  mm     G. Age:  24w 3d         41  %    CI:        73.23   %    70 - 86
                                                         FL/HC:      21.5   %    18.7 -
 HC:      222.1  mm     G. Age:  24w 2d         24  %    HC/AC:      1.05        1.05 -
 AC:      211.1  mm     G. Age:  25w 4d         78  %    FL/BPD:     79.8   %    71 - 87
 FL:       47.7  mm     G. Age:  26w 0d         82  %    FL/AC:      22.6   %    20 - 24

 Est. FW:     822  gm    1 lb 13 oz      87  %
OB History

 Gravidity:    2         Term:   1        Prem:   0        SAB:   0
 TOP:          0       Ectopic:  0        Living: 1
Gestational Age

 LMP:           24w 6d        Date:  09/17/21                 EDD:   06/24/22
 U/S Today:     25w 1d                                        EDD:   06/22/22
 Best:          24w 3d     Det. By:  U/S  (02/06/22)          EDD:   06/27/22
Anatomy

 Cranium:               Appears normal         LVOT:                   Previously seen
 Cavum:                 Previously seen        Aortic Arch:            Previously seen
 Ventricles:            Appears normal         Ductal Arch:            Previously seen
 Choroid Plexus:        Previously seen        Diaphragm:              Appears normal
 Cerebellum:            Appears normal         Stomach:                Appears normal, left
                                                                       sided
 Posterior Fossa:       Appears normal         Abdomen:                Appears normal
 Nuchal Fold:           Previously seen        Abdominal Wall:         Previously seen
 Face:                  Orbits and profile     Cord Vessels:           Previously seen
                        previously seen
 Lips:                  Previously seen        Kidneys:                Appear normal
 Palate:                Previously seen        Bladder:                Appears normal
 Thoracic:              Appears normal         Spine:                  Previously seen
 Heart:                 Appears normal         Upper Extremities:      Previously seen
                        (4CH, axis, and
                        situs)
 RVOT:                  Previously seen        Lower Extremities:      Previously seen

 Other:  VC, 3VV, 3VTV nasal bone, lenses, maxilla, mandible, falx, heels/feet
         and open hands/5th digits previously visualized. Fetus appears to be
         a male.
Cervix Uterus Adnexa

 Cervix
 Length:           3.29  cm.
 Normal appearance by transabdominal scan.
Comments

 This patient was seen for a follow up growth scan due to
 maternal sickle cell disease.  She denies any problems since
 her last exam and denies feeling any pain associated with
 sickle cell disease.
 She was informed that the fetal growth and amniotic fluid
 level appears appropriate for her gestational age.
 A follow up exam was scheduled in 4 weeks.
